# Patient Record
Sex: Male | Born: 1985 | Race: White | Hispanic: No | Marital: Single | Smoking: Current every day smoker
Health system: Southern US, Community
[De-identification: ages and names within clinical notes are randomized; demographics above are authoritative.]

## PROBLEM LIST (undated history)

## (undated) HISTORY — PX: FINGER SURGERY: SHX640

---
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
## 2005-12-09 ENCOUNTER — Emergency Department (HOSPITAL_COMMUNITY): Admission: EM | Admit: 2005-12-09 | Discharge: 2005-12-09 | Payer: Self-pay | Admitting: *Deleted

## 2006-05-28 ENCOUNTER — Emergency Department (HOSPITAL_COMMUNITY): Admission: EM | Admit: 2006-05-28 | Discharge: 2006-05-28 | Payer: Self-pay | Admitting: Emergency Medicine

## 2006-09-12 ENCOUNTER — Emergency Department (HOSPITAL_COMMUNITY): Admission: EM | Admit: 2006-09-12 | Discharge: 2006-09-12 | Payer: Self-pay | Admitting: Family Medicine

## 2007-03-02 ENCOUNTER — Emergency Department (HOSPITAL_COMMUNITY): Admission: EM | Admit: 2007-03-02 | Discharge: 2007-03-02 | Payer: Self-pay | Admitting: Emergency Medicine

## 2007-03-17 ENCOUNTER — Emergency Department (HOSPITAL_COMMUNITY): Admission: EM | Admit: 2007-03-17 | Discharge: 2007-03-17 | Payer: Self-pay | Admitting: Emergency Medicine

## 2007-07-20 ENCOUNTER — Emergency Department (HOSPITAL_COMMUNITY): Admission: EM | Admit: 2007-07-20 | Discharge: 2007-07-20 | Payer: Self-pay | Admitting: Family Medicine

## 2007-12-04 ENCOUNTER — Emergency Department (HOSPITAL_COMMUNITY): Admission: EM | Admit: 2007-12-04 | Discharge: 2007-12-04 | Payer: Self-pay | Admitting: Family Medicine

## 2008-04-17 ENCOUNTER — Emergency Department (HOSPITAL_COMMUNITY): Admission: EM | Admit: 2008-04-17 | Discharge: 2008-04-17 | Payer: Self-pay | Admitting: Emergency Medicine

## 2008-05-04 ENCOUNTER — Emergency Department (HOSPITAL_COMMUNITY): Admission: EM | Admit: 2008-05-04 | Discharge: 2008-05-04 | Payer: Self-pay | Admitting: Family Medicine

## 2008-05-30 ENCOUNTER — Encounter: Admission: RE | Admit: 2008-05-30 | Discharge: 2008-05-30 | Payer: Self-pay

## 2008-10-12 ENCOUNTER — Emergency Department (HOSPITAL_COMMUNITY): Admission: EM | Admit: 2008-10-12 | Discharge: 2008-10-12 | Payer: Self-pay | Admitting: Emergency Medicine

## 2009-03-28 ENCOUNTER — Emergency Department (HOSPITAL_COMMUNITY): Admission: EM | Admit: 2009-03-28 | Discharge: 2009-03-28 | Payer: Self-pay | Admitting: Emergency Medicine

## 2009-09-24 ENCOUNTER — Observation Stay (HOSPITAL_COMMUNITY): Admission: EM | Admit: 2009-09-24 | Discharge: 2009-09-25 | Payer: Self-pay | Admitting: Emergency Medicine

## 2011-01-19 LAB — DIFFERENTIAL
Basophils Absolute: 0 10*3/uL (ref 0.0–0.1)
Basophils Relative: 0 % (ref 0–1)
Lymphocytes Relative: 12 % (ref 12–46)
Neutro Abs: 7.8 10*3/uL — ABNORMAL HIGH (ref 1.7–7.7)
Neutrophils Relative %: 81 % — ABNORMAL HIGH (ref 43–77)

## 2011-01-19 LAB — POCT I-STAT, CHEM 8
BUN: 18 mg/dL (ref 6–23)
Chloride: 107 mEq/L (ref 96–112)
HCT: 46 % (ref 39.0–52.0)
Potassium: 3.6 mEq/L (ref 3.5–5.1)
Sodium: 142 mEq/L (ref 135–145)

## 2011-01-19 LAB — CBC
MCHC: 34.3 g/dL (ref 30.0–36.0)
RDW: 12.4 % (ref 11.5–15.5)

## 2011-01-26 LAB — URINALYSIS, ROUTINE W REFLEX MICROSCOPIC
Ketones, ur: NEGATIVE mg/dL
Leukocytes, UA: NEGATIVE
Nitrite: NEGATIVE
Specific Gravity, Urine: 1.023 (ref 1.005–1.030)
pH: 6 (ref 5.0–8.0)

## 2011-01-26 LAB — BASIC METABOLIC PANEL
BUN: 17 mg/dL (ref 6–23)
Creatinine, Ser: 1.11 mg/dL (ref 0.4–1.5)
GFR calc non Af Amer: >60 mL/min (ref >60)
Glucose, Bld: 103 mg/dL — ABNORMAL HIGH (ref 70–99)
Potassium: 4.1 mEq/L (ref 3.5–5.1)

## 2011-01-26 LAB — URINE MICROSCOPIC-ADD ON

## 2011-01-26 LAB — CBC
HCT: 40.4 % (ref 39.0–52.0)
Platelets: 228 10*3/uL (ref 150–400)
RDW: 12.6 % (ref 11.5–15.5)

## 2011-02-27 NOTE — Op Note (Signed)
[place]  Patient:    Devin Beasley, Devin Beasley Visit Number: [id number] MRN: [id number]          Service Type: DSU Location: [facility] Attending Physician:  Gustavus Messing Dictated by:   Yaakov Guthrie. Shon Hough, M.D. Proc. Date: 12/05/01 Admit Date:  12/05/2001 Discharge Date: 12/05/2001                             Operative Report  INDICATIONS:  A 25 year old with a large ganglion cyst involving the right dorsal wrist, with increased pain and discomfort on dorsiflexion.  PROCEDURE:  Excision of ganglion cyst of right dorsal wrist.  SURGEON:  Earvin Hansen L. Shon Hough, M.D.  ASSISTANT:  Margaretha Sheffield.  ANESTHESIA:  Bier block.  DESCRIPTION OF PROCEDURE:  The patient underwent a Bier block anesthesia.  A prep was done to the right hand and arm areas using Betadine soap and solution, and walled off with sterile towels and drapes, so as to make a sterile field.  A transverse incision was made over the mass and carried down through the skin and subcutaneous tissue and the superficial retinaculum, down to the ganglion cyst capsule.  Using the steel scissors I was able to dissect out gently around the whole mass in the underneath, and using the [facility] elevator I was able to dissect down to the root of the area, taking approximately 2.0 mm of synovium around the area, to remove it in toto.  After this the Bovie unit was used to coagulate around the edges of the wounds where the microscopic remained.  After this, an irrigation was done with saline and the wound was closed in layers with #5-0 Vicryl x 2 layers, then a running subcuticular stitch of #5-0 Vicryl.  Steri-Strips and soft dressings were applied to all the areas.  He withstood the procedures very well and was taken to the recovery room in excellent condition. Dictated by:   Yaakov Guthrie. Shon Hough, M.D. Attending Physician:  Gustavus Messing DD:  12/05/01 TD:  12/05/01 Job: [id number]

## 2011-05-10 ENCOUNTER — Inpatient Hospital Stay (INDEPENDENT_AMBULATORY_CARE_PROVIDER_SITE_OTHER)
Admission: RE | Admit: 2011-05-10 | Discharge: 2011-05-10 | Disposition: A | Discharge status: Home / Self Care | Payer: Self-pay | Source: Ambulatory Visit | Attending: Family Medicine | Admitting: Family Medicine

## 2011-05-10 DIAGNOSIS — L03019 Cellulitis of unspecified finger: Secondary | ICD-10-CM

## 2011-05-13 LAB — CULTURE, ROUTINE-ABSCESS

## 2011-07-30 LAB — POCT URINALYSIS DIP (DEVICE)
Glucose, UA: NEGATIVE
Ketones, ur: NEGATIVE
Operator id: 239701
Specific Gravity, Urine: 1.015

## 2013-08-28 ENCOUNTER — Emergency Department (HOSPITAL_COMMUNITY)
Admission: EM | Admit: 2013-08-28 | Discharge: 2013-08-29 | Disposition: A | Discharge status: Home / Self Care | Payer: Self-pay | Attending: Emergency Medicine | Admitting: Emergency Medicine

## 2013-08-28 ENCOUNTER — Encounter (HOSPITAL_COMMUNITY): Payer: Self-pay | Admitting: Emergency Medicine

## 2013-08-28 DIAGNOSIS — R6889 Other general symptoms and signs: Secondary | ICD-10-CM | POA: Insufficient documentation

## 2013-08-28 DIAGNOSIS — J309 Allergic rhinitis, unspecified: Secondary | ICD-10-CM | POA: Insufficient documentation

## 2013-08-28 DIAGNOSIS — F17200 Nicotine dependence, unspecified, uncomplicated: Secondary | ICD-10-CM | POA: Insufficient documentation

## 2013-08-28 DIAGNOSIS — F1110 Opioid abuse, uncomplicated: Secondary | ICD-10-CM | POA: Insufficient documentation

## 2013-08-28 LAB — CBC WITH DIFFERENTIAL/PLATELET
Basophils Relative: 1 % (ref 0–1)
Eosinophils Absolute: 0.3 10*3/uL (ref 0.0–0.7)
HCT: 38.4 % — ABNORMAL LOW (ref 39.0–52.0)
Hemoglobin: 13.8 g/dL (ref 13.0–17.0)
MCH: 34.6 pg — ABNORMAL HIGH (ref 26.0–34.0)
MCHC: 35.9 g/dL (ref 30.0–36.0)
Monocytes Absolute: 0.4 10*3/uL (ref 0.1–1.0)
Monocytes Relative: 5 % (ref 3–12)

## 2013-08-28 LAB — RAPID URINE DRUG SCREEN, HOSP PERFORMED
Amphetamines: NOT DETECTED
Cocaine: NOT DETECTED
Opiates: NOT DETECTED
Tetrahydrocannabinol: POSITIVE — AB

## 2013-08-28 LAB — COMPREHENSIVE METABOLIC PANEL
Albumin: 3.8 g/dL (ref 3.5–5.2)
BUN: 14 mg/dL (ref 6–23)
Chloride: 107 mEq/L (ref 96–112)
Creatinine, Ser: 0.93 mg/dL (ref 0.50–1.35)
GFR calc non Af Amer: >90 mL/min (ref >90)
Total Protein: 7.2 g/dL (ref 6.0–8.3)

## 2013-08-28 MED ORDER — CLONIDINE HCL 0.1 MG PO TABS
0.1000 mg | ORAL_TABLET | Freq: Four times a day (QID) | ORAL | Status: DC
  Administered 2013-08-29: 0.1 mg via ORAL
  Filled 2013-08-28: qty 1

## 2013-08-28 MED ORDER — CLONIDINE HCL 0.1 MG PO TABS: 0.1000 mg | ORAL_TABLET | ORAL | Status: DC

## 2013-08-28 MED ORDER — METHOCARBAMOL 500 MG PO TABS: 500.0000 mg | ORAL_TABLET | Freq: Three times a day (TID) | ORAL | Status: DC | PRN

## 2013-08-28 MED ORDER — HYDROXYZINE HCL 25 MG PO TABS: 25.0000 mg | ORAL_TABLET | Freq: Four times a day (QID) | ORAL | Status: DC | PRN

## 2013-08-28 MED ORDER — ONDANSETRON 4 MG PO TBDP: 4.0000 mg | ORAL_TABLET | Freq: Four times a day (QID) | ORAL | Status: DC | PRN

## 2013-08-28 MED ORDER — LOPERAMIDE HCL 2 MG PO CAPS: 2.0000 mg | ORAL_CAPSULE | ORAL | Status: DC | PRN

## 2013-08-28 MED ORDER — NAPROXEN 250 MG PO TABS: 500.0000 mg | ORAL_TABLET | Freq: Two times a day (BID) | ORAL | Status: DC | PRN

## 2013-08-28 MED ORDER — DICYCLOMINE HCL 20 MG PO TABS: 20.0000 mg | ORAL_TABLET | Freq: Four times a day (QID) | ORAL | Status: DC | PRN

## 2013-08-28 MED ORDER — CLONIDINE HCL 0.1 MG PO TABS: 0.1000 mg | ORAL_TABLET | Freq: Every day | ORAL | Status: DC

## 2013-08-28 NOTE — ED Notes (Signed)
Pt st's he takes 1-3 Roxicodones everyday

## 2013-08-28 NOTE — ED Provider Notes (Signed)
CSN: [id number]     Arrival date & time 08/28/13  2103 History   First MD Initiated Contact with Patient 08/28/13 2116     Chief Complaint  Patient presents with  . Drug Problem   (Consider location/radiation/quality/duration/timing/severity/associated sxs/prior Treatment) HPI Comments: Patient presents to the ER for evaluation of drug abuse and medical clearance for detox. The patient reports a two-year history of progressively worsening production and use of oral pain killers. Patient reports his last use was 2 nights ago. He has slight sneezing and runny nose, no other significant withdrawal symptoms. Patient has not had any redness. There is no homicidality or suicidality.  Patient is a 27 y.o. male presenting with drug problem.  Drug Problem    History reviewed. No pertinent past medical history. Past Surgical History  Procedure Laterality Date  . Finger surgery     No family history on file. History  Substance Use Topics  . Smoking status: Current Every Day Smoker  . Smokeless tobacco: Not on file  . Alcohol Use: No    Review of Systems  HENT: Positive for rhinorrhea and sneezing.   Respiratory: Negative.   Cardiovascular: Negative.   All other systems reviewed and are negative.    Allergies  Review of patient's allergies indicates no known allergies.  Home Medications  No current outpatient prescriptions on file. BP 122/66  Pulse 69  Temp(Src) 98.1 F (36.7 C) (Oral)  Resp 20  Wt 202 lb 6 oz (91.797 kg)  SpO2 99% Physical Exam  Constitutional: He is oriented to person, place, and time. He appears well-developed and well-nourished. No distress.  HENT:  Head: Normocephalic and atraumatic.  Right Ear: Hearing normal.  Left Ear: Hearing normal.  Nose: Nose normal.  Mouth/Throat: Oropharynx is clear and moist and mucous membranes are normal.  Eyes: Conjunctivae and EOM are normal. Pupils are equal, round, and reactive to light.  Neck: Normal range of  motion. Neck supple.  Cardiovascular: Regular rhythm, S1 normal and S2 normal.  Exam reveals no gallop and no friction rub.   No murmur heard. Pulmonary/Chest: Effort normal and breath sounds normal. No respiratory distress. He exhibits no tenderness.  Abdominal: Soft. Normal appearance and bowel sounds are normal. There is no hepatosplenomegaly. There is no tenderness. There is no rebound, no guarding, no tenderness at McBurney's point and negative Murphy's sign. No hernia.  Musculoskeletal: Normal range of motion.  Neurological: He is alert and oriented to person, place, and time. He has normal strength. No cranial nerve deficit or sensory deficit. Coordination normal. GCS eye subscore is 4. GCS verbal subscore is 5. GCS motor subscore is 6.  Skin: Skin is warm, dry and intact. No rash noted. No cyanosis.  Psychiatric: He has a normal mood and affect. His speech is normal and behavior is normal. Thought content normal.    ED Course  Procedures (including critical care time) Labs Review Labs Reviewed  URINE RAPID DRUG SCREEN (HOSP PERFORMED)  ETHANOL  ACETAMINOPHEN LEVEL  SALICYLATE LEVEL  CBC WITH DIFFERENTIAL  COMPREHENSIVE METABOLIC PANEL   Imaging Review No results found.  EKG Interpretation   None       MDM  Diagnosis: Opiate abuse  Patient presents to the ER stating that he has been abusing opiate painkillers for 2 years. He reports that he has contacted [facility], was told to come to the ER for medical clearance. Screening labs have been performed. Patient is not homicidal or suicidal. He is  appropriately cleared for detox.   Gilda Crease, MD 08/28/13 2241

## 2013-08-28 NOTE — ED Notes (Signed)
Paper scrubs given to pt. at triage / security notified to wand pt. at triage .

## 2013-08-28 NOTE — ED Notes (Signed)
Pt changing into scrubs 

## 2013-08-28 NOTE — ED Notes (Signed)
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 Denies visual or auditory hallucinations .

## 2013-08-29 ENCOUNTER — Encounter (HOSPITAL_COMMUNITY): Payer: Self-pay | Admitting: *Deleted

## 2013-08-29 MED ORDER — PROMETHAZINE HCL 25 MG PO TABS: 25.0000 mg | ORAL_TABLET | Freq: Four times a day (QID) | ORAL | Status: DC | PRN

## 2013-08-29 NOTE — ED Notes (Signed)
Up to bathroom voided with any problem

## 2013-08-29 NOTE — ED Notes (Signed)
Referral sent to [facility] for detox. Pt requested. [facility] does have 1 male detox bed

## 2013-08-29 NOTE — ED Notes (Addendum)
CALLED [facility] TO CHECK ON STATUS OF REFERRAL. SPOKE WITH DARLENE. SHE STATES SHE HAS NOT REVIEWED PT CHART AS YET

## 2013-08-29 NOTE — ED Provider Notes (Addendum)
Patient with hypotension. On clonidine protocol. Will hold next dose and reevaluate  Devin Donath R. Rubin Payor, MD 08/29/13 [phone]  Unable to find placement. He will discharge  Juliet Rude. Rubin Payor, MD 08/29/13 [phone]

## 2013-08-29 NOTE — ED Notes (Signed)
Pt updated on referral. Per darlene at [facility] she never received pt referral. refaxed papers to a different fax number

## 2013-08-29 NOTE — BH Assessment (Signed)
Assessment Note  Devin Beasley is a 27 y.o. male who presents with wife(who also req detox from opiates) to [facility] for detox from opiates(roxicodone).  Pt denies SI/HI/AVH, no issues with seizures/blackouts. Pt reports he uses 3 roxicodone pills, daily, last use was 2 days ago. Pt used 3 pills.  Pt has no current legal charges but is on probation for marijuana charges.  Pt also uses 1 bowl of marijuana, daily, last use was 08/28/13.  Pt is c/o w/d sxs: clammy and diarrhea.  Pt has no previous detox hx.  Pt states he contacted [facility]) for inpt admission and was informed that he needed to come to emerg dept for medical clearance before possible admission.  This Clinical research associate discussed disposition with Dr. Hyacinth Meeker and informed him that [facility] will seek placement for pt.      Axis I: Opiate Dependence  Axis II: Deferred Axis III: History reviewed. No pertinent past medical history. Axis IV: other psychosocial or environmental problems, problems related to social environment and problems with primary support group Axis V: 51-60 moderate symptoms  Past Medical History: History reviewed. No pertinent past medical history.  Past Surgical History  Procedure Laterality Date  . Finger surgery      Family History: No family history on file.  Social History:  reports that he has been smoking.  He does not have any smokeless tobacco history on file. He reports that he uses illicit drugs. He reports that he does not drink alcohol.  Additional Social History:  Alcohol / Drug Use Pain Medications: None  Prescriptions: None  Over the Counter: None  History of alcohol / drug use?: Yes Longest period of sobriety (when/how long): None  Negative Consequences of Use: Work / School;Personal relationships;Legal;Financial Withdrawal Symptoms: Diarrhea;Other (Comment) (Clammy ) Substance #1 Name of Substance 1: Opiates--Roxicodone  1 - Age of First Use: 24 YOM  1 - Amount (size/oz): 3 Pills  1 - Frequency:  Daily  1 - Duration: On-going  1 - Last Use / Amount: 2 Days Ago   CIWA: CIWA-Ar BP: 99/51 mmHg Pulse Rate: 57 Nausea and Vomiting: no nausea and no vomiting Tactile Disturbances: none Tremor: no tremor Auditory Disturbances: not present Paroxysmal Sweats: no sweat visible Visual Disturbances: not present Anxiety: no anxiety, at ease Headache, Fullness in Head: none present Agitation: normal activity Orientation and Clouding of Sensorium: oriented and can do serial additions CIWA-Ar Total: 5 COWS:    Allergies: No Known Allergies  Home Medications:  (Not in a hospital admission)  OB/GYN Status:  No LMP for male patient.  General Assessment Data Location of Assessment: [facility] ED Is this a Tele or Face-to-Face Assessment?: Tele Assessment Is this an Initial Assessment or a Re-assessment for this encounter?: Initial Assessment Living Arrangements: Spouse/significant other (Lives with spouse ) Can pt return to current living arrangement?: Yes Admission Status: Voluntary Is patient capable of signing voluntary admission?: Yes Transfer from: Acute Hospital Referral Source: MD  Medical Screening Exam [facility] Walk-in ONLY) Medical Exam completed: No Reason for MSE not completed: Other:  [facility] Crisis Care Plan Living Arrangements: Spouse/significant other (Lives with spouse ) Name of Psychiatrist: None  Name of Therapist: None   Education Status Is patient currently in school?: No Current Grade: None  Highest grade of school patient has completed: None  Name of school: None  Contact person: None   Risk to self Suicidal Ideation: No Suicidal Intent: No Is patient at risk for suicide?: No Suicidal Plan?: No Access to Means:  No What has been your use of drugs/alcohol within the last 12 months?: Abusing: opiates(roxicodone)  Previous Attempts/Gestures: No How many times?: 0 Other Self Harm Risks: None  Triggers for Past Attempts: None known Intentional Self Injurious  Behavior: None Family Suicide History: No Recent stressful life event(s): Other (Comment);Financial Problems (Chronic SA ) Persecutory voices/beliefs?: No Depression: Yes Depression Symptoms: Loss of interest in usual pleasures Substance abuse history and/or treatment for substance abuse?: No Suicide prevention information given to non-admitted patients: Not applicable  Risk to Others Homicidal Ideation: No Thoughts of Harm to Others: No Current Homicidal Intent: No Current Homicidal Plan: No Access to Homicidal Means: No Identified Victim: None  History of harm to others?: No Assessment of Violence: None Noted Violent Behavior Description: None  Does patient have access to weapons?: No Criminal Charges Pending?: No Does patient have a court date: No  Psychosis Hallucinations: None noted Delusions: None noted  Mental Status Report Appear/Hygiene: Disheveled Eye Contact: Fair Motor Activity: Unremarkable Speech: Logical/coherent Level of Consciousness: Alert Mood: Sad Affect: Sad Anxiety Level: None Thought Processes: Coherent;Relevant Judgement: Unimpaired Orientation: Person;Place;Time;Situation Obsessive Compulsive Thoughts/Behaviors: None  Cognitive Functioning Concentration: Normal Memory: Recent Intact;Remote Intact IQ: Average Insight: Fair Impulse Control: Fair Appetite: Good Weight Loss: 0 Weight Gain: 0 Sleep: Decreased Total Hours of Sleep: 6 Vegetative Symptoms: None  ADLScreening [facility] Assessment Services) Patient's cognitive ability adequate to safely complete daily activities?: Yes Patient able to express need for assistance with ADLs?: Yes Independently performs ADLs?: Yes (appropriate for developmental age)  Prior Inpatient Therapy Prior Inpatient Therapy: No Prior Therapy Dates: None  Prior Therapy Facilty/Provider(s): None  Reason for Treatment: None   Prior Outpatient Therapy Prior Outpatient Therapy: No Prior Therapy Dates: None   Prior Therapy Facilty/Provider(s): None  Reason for Treatment: None   ADL Screening (condition at time of admission) Patient's cognitive ability adequate to safely complete daily activities?: Yes Is the patient deaf or have difficulty hearing?: No Does the patient have difficulty seeing, even when wearing glasses/contacts?: No Does the patient have difficulty concentrating, remembering, or making decisions?: No Patient able to express need for assistance with ADLs?: Yes Does the patient have difficulty dressing or bathing?: No Independently performs ADLs?: Yes (appropriate for developmental age) Does the patient have difficulty walking or climbing stairs?: No Weakness of Legs: None Weakness of Arms/Hands: None  Home Assistive Devices/Equipment Home Assistive Devices/Equipment: None  Therapy Consults (therapy consults require a physician order) PT Evaluation Needed: No OT Evalulation Needed: No SLP Evaluation Needed: No Abuse/Neglect Assessment (Assessment to be complete while patient is alone) Physical Abuse: Denies Verbal Abuse: Denies Sexual Abuse: Denies Exploitation of patient/patient's resources: Denies Self-Neglect: Denies Values / Beliefs Cultural Requests During Hospitalization: None Spiritual Requests During Hospitalization: None Consults Spiritual Care Consult Needed: No Social Work Consult Needed: No Merchant navy officer (For Healthcare) Advance Directive: Patient does not have advance directive;Patient would not like information Pre-existing out of facility DNR order (yellow form or pink MOST form): No Nutrition Screen- [facility] Adult/[facility]/AP Patient's home diet: Regular  Additional Information 1:1 In Past 12 Months?: No CIRT Risk: No Elopement Risk: No Does patient have medical clearance?: Yes     Disposition:  Disposition Initial Assessment Completed for this Encounter: Yes Disposition of Patient: Referred to (Pt req possible admission w/ADACT) Patient  referred to: Other (Comment) (Pt req possible admission w/ADACT )  On Site Evaluation by:   Reviewed with Physician:    Murrell Redden 08/29/2013 8:32 AM

## 2013-08-29 NOTE — ED Notes (Signed)
Pt is on phone with his brother. Pt brother has disclosed that pt is on probation. Pt is discussing with brother that he may want to leave.

## 2013-08-29 NOTE — ED Notes (Signed)
TTS talking to pt now 

## 2013-08-29 NOTE — ED Notes (Signed)
Pt given belongings and valuables.

## 2014-05-08 ENCOUNTER — Encounter (HOSPITAL_COMMUNITY): Payer: Self-pay | Admitting: Emergency Medicine

## 2014-05-08 ENCOUNTER — Emergency Department (HOSPITAL_COMMUNITY)
Admission: EM | Admit: 2014-05-08 | Discharge: 2014-05-08 | Disposition: A | Discharge status: Home / Self Care | Payer: Self-pay | Attending: Emergency Medicine | Admitting: Emergency Medicine

## 2014-05-08 ENCOUNTER — Emergency Department (HOSPITAL_COMMUNITY): Payer: Self-pay

## 2014-05-08 DIAGNOSIS — Z79899 Other long term (current) drug therapy: Secondary | ICD-10-CM | POA: Insufficient documentation

## 2014-05-08 DIAGNOSIS — R220 Localized swelling, mass and lump, head: Secondary | ICD-10-CM | POA: Insufficient documentation

## 2014-05-08 DIAGNOSIS — R221 Localized swelling, mass and lump, neck: Secondary | ICD-10-CM

## 2014-05-08 DIAGNOSIS — K047 Periapical abscess without sinus: Secondary | ICD-10-CM | POA: Insufficient documentation

## 2014-05-08 DIAGNOSIS — F17200 Nicotine dependence, unspecified, uncomplicated: Secondary | ICD-10-CM | POA: Insufficient documentation

## 2014-05-08 DIAGNOSIS — L03211 Cellulitis of face: Secondary | ICD-10-CM

## 2014-05-08 DIAGNOSIS — L0201 Cutaneous abscess of face: Secondary | ICD-10-CM | POA: Insufficient documentation

## 2014-05-08 LAB — BASIC METABOLIC PANEL
ANION GAP: 12 (ref 5–15)
BUN: 8 mg/dL (ref 6–23)
CALCIUM: 9.3 mg/dL (ref 8.4–10.5)
CHLORIDE: 102 meq/L (ref 96–112)
CO2: 27 meq/L (ref 19–32)
Creatinine, Ser: 0.86 mg/dL (ref 0.50–1.35)
GFR calc Af Amer: >90 mL/min (ref >90)
GFR calc non Af Amer: >90 mL/min (ref >90)
Glucose, Bld: 86 mg/dL (ref 70–99)
POTASSIUM: 4.1 meq/L (ref 3.7–5.3)
Sodium: 141 mEq/L (ref 137–147)

## 2014-05-08 LAB — CBC
HCT: 39.5 % (ref 39.0–52.0)
HEMOGLOBIN: 13.6 g/dL (ref 13.0–17.0)
MCH: 34.2 pg — ABNORMAL HIGH (ref 26.0–34.0)
MCHC: 34.4 g/dL (ref 30.0–36.0)
MCV: 99.2 fL (ref 78.0–100.0)
PLATELETS: 210 10*3/uL (ref 150–400)
RBC: 3.98 MIL/uL — AB (ref 4.22–5.81)
RDW: 12.8 % (ref 11.5–15.5)
WBC: 10.6 10*3/uL — AB (ref 4.0–10.5)

## 2014-05-08 MED ORDER — OXYCODONE-ACETAMINOPHEN 5-325 MG PO TABS: 1.0000 | ORAL_TABLET | Freq: Four times a day (QID) | ORAL | Status: DC | PRN

## 2014-05-08 MED ORDER — CLINDAMYCIN PHOSPHATE 600 MG/50ML IV SOLN
600.0000 mg | Freq: Once | INTRAVENOUS | Status: AC
  Administered 2014-05-08: 600 mg via INTRAVENOUS
  Filled 2014-05-08: qty 50

## 2014-05-08 MED ORDER — CLINDAMYCIN HCL 150 MG PO CAPS: 450.0000 mg | ORAL_CAPSULE | Freq: Three times a day (TID) | ORAL | Status: DC

## 2014-05-08 MED ORDER — OXYCODONE-ACETAMINOPHEN 5-325 MG PO TABS
1.0000 | ORAL_TABLET | Freq: Once | ORAL | Status: AC
  Administered 2014-05-08: 1 via ORAL
  Filled 2014-05-08: qty 1

## 2014-05-08 MED ORDER — IOHEXOL 300 MG/ML  SOLN
70.0000 mL | Freq: Once | INTRAMUSCULAR | Status: AC | PRN
  Administered 2014-05-08: 70 mL via INTRAVENOUS

## 2014-05-08 MED ORDER — OXYCODONE HCL 5 MG PO TABS
10.0000 mg | ORAL_TABLET | Freq: Once | ORAL | Status: AC
  Administered 2014-05-08: 10 mg via ORAL
  Filled 2014-05-08: qty 2

## 2014-05-08 NOTE — ED Notes (Signed)
Pt states 3 right upper teeth infected and now has spread up into right side of face and eye.

## 2014-05-08 NOTE — ED Notes (Signed)
Pt given a LolliCaine swab to help numb his pain.

## 2014-05-08 NOTE — Discharge Instructions (Signed)
Abscessed Tooth An abscessed tooth is an infection around your tooth. It may be caused by holes or damage to the tooth (cavity) or a dental disease. An abscessed tooth causes mild to very bad pain in and around the tooth. See your dentist right away if you have tooth or gum pain. HOME CARE  Take your medicine as told. Finish it even if you start to feel better.  Do not drive after taking pain medicine.  Rinse your mouth (gargle) often with salt water ( teaspoon salt in 8 ounces of warm water).  Do not apply heat to the outside of your face. GET HELP RIGHT AWAY IF:   You have a temperature by mouth above 102 F (38.9 C), not controlled by medicine.  You have chills and a very bad headache.  You have problems breathing or swallowing.  Your mouth will not open.  You develop puffiness (swelling) on the neck or around the eye.  Your pain is not helped by medicine.  Your pain is getting worse instead of better. MAKE SURE YOU:   Understand these instructions.  Will watch your condition.  Will get help right away if you are not doing well or get worse. Document Released: 03/16/2008 Document Revised: 12/21/2011 Document Reviewed: 01/06/2011 [facility] Patient Information 2015 [place], [place]. This information is not intended to replace advice given to you by your health care provider. Make sure you discuss any questions you have with your health care provider. Cellulitis Cellulitis is an infection of the skin and the tissue beneath it. The infected area is usually red and tender. Cellulitis occurs most often in the arms and lower legs.  CAUSES  Cellulitis is caused by bacteria that enter the skin through cracks or cuts in the skin. The most common types of bacteria that cause cellulitis are staphylococci and streptococci. SIGNS AND SYMPTOMS   Redness and warmth.  Swelling.  Tenderness or pain.  Fever. DIAGNOSIS  Your health care provider can usually determine what is wrong  based on a physical exam. Blood tests may also be done. TREATMENT  Treatment usually involves taking an antibiotic medicine. HOME CARE INSTRUCTIONS   Take your antibiotic medicine as directed by your health care provider. Finish the antibiotic even if you start to feel better.  Keep the infected arm or leg elevated to reduce swelling.  Apply a warm cloth to the affected area up to 4 times per day to relieve pain.  Take medicines only as directed by your health care provider.  Keep all follow-up visits as directed by your health care provider. SEEK MEDICAL CARE IF:   You notice red streaks coming from the infected area.  Your red area gets larger or turns dark in color.  Your bone or joint underneath the infected area becomes painful after the skin has healed.  Your infection returns in the same area or another area.  You notice a swollen bump in the infected area.  You develop new symptoms.  You have a fever. SEEK IMMEDIATE MEDICAL CARE IF:   You feel very sleepy.  You develop vomiting or diarrhea.  You have a general ill feeling (malaise) with muscle aches and pains. MAKE SURE YOU:   Understand these instructions.  Will watch your condition.  Will get help right away if you are not doing well or get worse. Document Released: 07/08/2005 Document Revised: 02/12/2014 Document Reviewed: 12/14/2011 [facility] Patient Information 2015 [place], [place]. This information is not intended to replace advice given to you by  your health care provider. Make sure you discuss any questions you have with your health care provider.   Emergency Department Resource Guide 1) Find a Doctor and Pay Out of Pocket Although you won't have to find out who is covered by your insurance plan, it is a good idea to ask around and get recommendations. You will then need to call the office and see if the doctor you have chosen will accept you as a new patient and what types of options they offer for  patients who are self-pay. Some doctors offer discounts or will set up payment plans for their patients who do not have insurance, but you will need to ask so you aren't surprised when you get to your appointment.  2) Contact Your Local Health Department Not all health departments have doctors that can see patients for sick visits, but many do, so it is worth a call to see if yours does. If you don't know where your local health department is, you can check in your phone book. The CDC also has a tool to help you locate your state's health department, and many state websites also have listings of all of their local health departments.  3) Find a Walk-in Clinic If your illness is not likely to be very severe or complicated, you may want to try a walk in clinic. These are popping up all over the country in pharmacies, drugstores, and shopping centers. They're usually staffed by nurse practitioners or physician assistants that have been trained to treat common illnesses and complaints. They're usually fairly quick and inexpensive. However, if you have serious medical issues or chronic medical problems, these are probably not your best option.  No Primary Care Doctor: - Call Health Connect at  [phone] - they can help you locate a primary care doctor that  accepts your insurance, provides certain services, etc. - Physician Referral Service- [phone]  Chronic Pain Problems: Organization         Address  Phone   Notes  [facility]  [phone] Patients need to be referred by their primary care doctor.   Medication Assistance: Organization         Address  Phone   Notes  [facility] Medication [facility] P[facility]istance Program [address] [place], [place] [address] [phone] --Must be a resident of [place] -- Must have NO insurance coverage whatsoever (no Medicaid/ Medicare, etc.) -- The pt. MUST have a primary care doctor that directs their care regularly  and follows them in the community   MedAssist  [phone]) [phone]   [employer]  [phone]    Agencies that provide inexpensive medical care: Organization         Address  Phone   Notes  [facility]  [phone]   [facility]    [phone]   [facility] [address] [place], [place] [address] [phone]   [facility] [place] [address], [place] [phone]) [phone]   [facility]anned Parenthood    [phone]) [phone]   [facility]    [phone]   [facility]  [address], [place] Phone:  [phone]) [phone], Fax:  [phone] Hours of Operation:  9 am - 6 pm, M-F.  Also accepts Medicaid/Medicare and self-pay.  [facility]  [address], [place] Phone: [phone]) [phone], Fax: [phone]. Hours  of Operation:  8:30 am - 5:30 pm, M-F.  Also accepts Medicaid and self-pay.  [facility] [place] [address], [place] Phone: [phone]) [phone]   [facility] [address], [place], [place] [phone], Ext. [phone] Mondays & Thursdays: 7-9 AM.  First 15 patients are seen on a first come, first serve basis.    Medicaid-accepting [facility] [place] Providers:  Organization         Address  Phone   Notes  [facility] [address], [place] [phone] Also accepts self-pay patients.  [facility] : [facility] [address], [place]  [phone]   [facility] [address], [place] [phone]) [phone]   [facility] [address] [place] [address], [place] [phone]   Renaye RakersVeita Bland [address] [place] [address], [place]   [phone] Only accepts [place] Access [place] patients after they have their name applied to their card.   Self-Pay (no insurance) in [place]:  Organization         Address  Phone   Notes  [facility]ickle  Cell Patients, [facility] [address], [place] [phone]   [facility] [address], [place] [phone]   [facility] [place]  [address], [place] [phone]   [facility]/Dr. Osei-Bonsu  [address] [place] [address], [place] or [id number], [place] [phone] Phone number for both [place] and [place] locations is the same.  [facility] [address], [place] [phone]   [facility]  [address] [place] [address], [place] or [address] [phone]) [phone]   [facility] [address], [place] [phone], phone; [phone], fax Sees patients 1st and 3rd Saturday of every month.  Must not qualify for public or private insurance (i.e. Medicaid, Medicare, [place] Health Choice, Veterans' Benefits)  Household income should be no more than 200% of the poverty level The clinic cannot treat you if you are pregnant or think you are pregnant  Sexually transmitted diseases are not treated at the clinic.    Dental Care: Organization         Address  Phone  Notes  [facility] [address], [place] [phone]) [phone] Accepts children up to age 29 who are enrolled in [place]icaid or [place] Health Choice; pregnant women with a Medicaid card; and children who have applied for Medicaid or [place] Health Choice, but were declined, whose parents can pay a reduced fee at time of service.  [facility] [place]  [address], [place] [phone] Accepts children up to age 29 who are enrolled in [place] or [place] Health Choice; pregnant women with a Medicaid card; and children who have applied for Medicaid or [place] Health Choice, but were declined, whose parents can pay a reduced fee at time of service.  [facility]  [address], [place] [phone] Patients are seen by appointment only. Walk-ins are not accepted. [facility] will see patients 29 years of age and older. Monday - Tuesday (8am-5pm) Most Wednesdays (8:30-5pm) $30 per visit, cash only  [facility]  [address], [place] [phone] Patients are  seen by appointment only. Walk-ins are not accepted. [facility] will see patients 618 years of age and older. One Wednesday Evening (Monthly: Volunteer Based).  $30 per visit, cash only  [employer] [facility] [facility]  [phone] for adults; Children under age 624, call Graduate Pediatric Dentistry at [phone]) [phone]. Children aged 164-14, please call [phone] to request a pediatric application.  Dental services are provided in all areas of dental care including fillings, crowns and bridges, complete and partial dentures, implants, gum treatment, root canals, and extractions. Preventive care is also provided. Treatment is provided to both adults and children. Patients are selected via a lottery and there is often a waiting list.   [facility] [address] [address], [place]  [phone]) [phone] [email or web address]   [facility] [address], [place], [place] [phone], Ext. [phone] Second and Fourth Thursday of each month, opens at 6:30 AM; Clinic ends at 9 AM.  Patients are seen on a first-come first-served basis, and a limited number are seen during each clinic.   [facility]  [address], [place], [place] [phone]   Eligibility Requirements You must have lived in [place], [place], or [place] counties for at least the last three months.   You cannot be eligible for state or federal sponsored National Cityhealthcare insurance, including CIGNAVeterans Administration, [place]icaid, or [employer] EntertainmentMedicare.   You generally cannot be eligible for healthcare insurance through your employer.    How to apply: Eligibility  screenings are held every Tuesday and Wednesday afternoon from 1:00 pm until 4:00 pm. You do not need an appointment for the interview!  [facility] [address], [place], [place] [phone]   [facility]  [phone]   [facility]  [phone]   [facility]  [phone]    Behavioral Health Resources in the Community: Intensive Outpatient Programs Organization         Address  Phone  Notes  [facility] [address], [place], [place] [phone]   [facility] [address], [place], [place] [phone]   [facility] [address], [place], [place]  [phone]   [facility] [address],  [place], [place] [phone] or [phone]   Substance Abuse Resources Organization         Address  Phone  Notes  [facility]  [phone]   [facility]  [phone]   [facility]  [phone]   [facility]  [phone]   Residential & Outpatient Substance Abuse Program  [phone]   Psychological Services Organization         Address  Phone  Notes  [facility]  [phone]- [phone]   [facility]  [phone]- [phone]   [facility] [address], [place] [phone] or [phone]    Mobile Crisis Teams Organization         Address  Phone  Notes  [facility], Mobile Crisis Care Unit  [phone]   [facility]  [address] [place], [place] [phone]   Doristine LocksSharon DeEsch [address] [place] [phone]    Self-Help/Support Groups Organization         Address  Phone             Notes  [facility] [place] - variety of support groups  336- [phone] Call for more  information  °Narcotics Anonymous (NA), [facility] [address], °[place]  2 meetings at  this location  ° °Residential Treatment Programs °Organization         Address  Phone  Notes  °[facility] [address],    °[place]  [phone]   °[facility] ° [address], [place], [place] [phone]   °[facility] [address], [place] [phone] Admissions: 8am-3pm M-F  °[facility] [address]    °[place], [place] [phone]   °[facility] [address], [place], [place] [phone]   °[facility] [address]  °[place], [place] [phone]   °[facility] - Intensive Outpatient [address], [place], [place] [phone]   °[facility] ([facility] [address]  °[place], [place] 1-877-615-2722 or 336-784-9470   °[facility] ([facility]) [address] [place], [place] [phone] Accepts Medicaid  °[facility] [address]  °[place] 1-800-659-3381 Substance Abuse/Addiction Treatment  ° °[place] Behavioral Health Resources °Organization         Address  Phone  Notes  °[facility]  (888) [phone]   °Julie Brannon, PhD [address] [place], [place]   (336) [phone] or (336) 951-0000   °[facility]   [address] °[place], [place] (336) 349-4454   °[facility] [address], [place], [place] (336) [phone] Insurance/Medicaid/sponsorship through [facility]  °[facility] [address]                                    [place], [place] (336) [phone] Therapy/tele-psych/case  °[facility] [address]  ° [place], [place] (336) [phone]    °Dr. Arfeen  (336) [phone]   °[facility] [place]  [employer] [place] [facility] 1) [address], [place] °2) [address], [place] °3)  [address], [place] (336) [phone] °(336) 342-7768 ° °(336) [phone]   °[place] Child Abuse Hotline (336) 342-1394 or (336)  342-3537 (After Hours)    ° ° ° °

## 2014-05-11 NOTE — ED Provider Notes (Signed)
CSN: [id number]     Arrival date & time 05/08/14  1614 History   First MD Initiated Contact with Patient 05/08/14 2014     Chief Complaint  Patient presents with  . Facial Swelling     (Consider location/radiation/quality/duration/timing/severity/associated sxs/prior Treatment) Patient is a 28 y.o. male presenting with tooth pain. The history is provided by the patient. No language interpreter was used.  Dental Pain Location:  Upper Upper teeth location:  6/RU cuspid Quality:  Dull Severity:  Moderate Onset quality:  Sudden Duration:  3 days Timing:  Constant Progression:  Worsening Chronicity:  New Context: dental caries and poor dentition   Relieved by:  Nothing Worsened by:  Pressure and touching Ineffective treatments:  None tried Associated symptoms: facial pain, facial swelling and gum swelling   Associated symptoms: no congestion, no difficulty swallowing, no drooling, no fever, no headaches, no neck swelling and no trismus     History reviewed. No pertinent past medical history. Past Surgical History  Procedure Laterality Date  . Finger surgery     No family history on file. History  Substance Use Topics  . Smoking status: Current Every Day Smoker  . Smokeless tobacco: Not on file  . Alcohol Use: No    Review of Systems  Constitutional: Negative for fever, activity change, appetite change and fatigue.  HENT: Positive for dental problem and facial swelling. Negative for congestion, drooling, rhinorrhea and trouble swallowing.   Eyes: Negative for photophobia and pain.  Respiratory: Negative for cough, chest tightness and shortness of breath.   Cardiovascular: Negative for chest pain and leg swelling.  Gastrointestinal: Negative for nausea, vomiting, abdominal pain, diarrhea and constipation.  Endocrine: Negative for polydipsia and polyuria.  Genitourinary: Negative for dysuria, urgency, decreased urine volume and difficulty urinating.  Musculoskeletal:  Negative for back pain and gait problem.  Skin: Negative for color change, rash and wound.  Allergic/Immunologic: Negative for immunocompromised state.  Neurological: Negative for dizziness, facial asymmetry, speech difficulty, weakness, numbness and headaches.  Psychiatric/Behavioral: Negative for confusion, decreased concentration and agitation.      Allergies  Review of patient's allergies indicates no known allergies.  Home Medications   Prior to Admission medications   Medication Sig Start Date End Date Taking? Authorizing Provider  ibuprofen (ADVIL,MOTRIN) 200 MG tablet Take 800 mg by mouth every 4 (four) hours as needed for mild pain (toothache).   Yes Historical Provider, MD  clindamycin (CLEOCIN) 150 MG capsule Take 3 capsules (450 mg total) by mouth 3 (three) times daily. For 10 days. Take all pills. 05/08/14   Shanna CiscoMegan E Docherty, MD  oxyCODONE-acetaminophen (PERCOCET/ROXICET) 5-325 MG per tablet Take 1 tablet by mouth every 6 (six) hours as needed for moderate pain or severe pain. 05/08/14   Shanna CiscoMegan E Docherty, MD   BP 121/74  Pulse 76  Temp(Src) 98.3 F (36.8 C) (Oral)  Resp 14  SpO2 99% Physical Exam  Constitutional: He is oriented to person, place, and time. He appears well-developed and well-nourished. No distress.  HENT:  Head: Normocephalic and atraumatic.    Mouth/Throat: No oropharyngeal exudate.    Eyes: Pupils are equal, round, and reactive to light.  Neck: Normal range of motion. Neck supple.  Cardiovascular: Normal rate, regular rhythm and normal heart sounds.  Exam reveals no gallop and no friction rub.   No murmur heard. Pulmonary/Chest: Effort normal and breath sounds normal. No respiratory distress. He has no wheezes. He has no rales.  Abdominal: Soft. Bowel sounds are normal. He exhibits  no distension and no mass. There is no tenderness. There is no rebound and no guarding.  Musculoskeletal: Normal range of motion. He exhibits no edema and no tenderness.   Neurological: He is alert and oriented to person, place, and time.  Skin: Skin is warm and dry.  Psychiatric: He has a normal mood and affect.    ED Course  INCISION AND DRAINAGE Date/Time: 05/08/2014 9:37 PM Performed by: Toy Cookey, E Authorized by: Toy Cookey, E Consent: Verbal consent obtained. written consent not obtained. Risks and benefits: risks, benefits and alternatives were discussed Consent given by: patient Patient understanding: patient states understanding of the procedure being performed Patient consent: the patient's understanding of the procedure matches consent given Procedure consent: procedure consent matches procedure scheduled Relevant documents: relevant documents present and verified Test results: test results available and properly labeled Site marked: the operative site was marked Imaging studies: imaging studies available Required items: required blood products, implants, devices, and special equipment available Patient identity confirmed: verbally with patient Time out: Immediately prior to procedure a "time out" was called to verify the correct patient, procedure, equipment, support staff and site/side marked as required. Type: abscess Body area: mouth Location details: alveolar process Anesthesia: local infiltration Local anesthetic: lidocaine 1% with epinephrine Anesthetic total: 2 ml Patient sedated: no Scalpel size: 11 Incision type: elliptical Complexity: simple Drainage: purulent Drainage amount: copious Patient tolerance: Patient tolerated the procedure well with no immediate complications.   (including critical care time) Labs Review Labs Reviewed  CBC - Abnormal; Notable for the following:    WBC 10.6 (*)    RBC 3.98 (*)    MCH 34.2 (*)    All other components within normal limits  BASIC METABOLIC PANEL    Imaging Review No results found.   EKG Interpretation None      MDM   Final diagnoses:  Dental abscess    Facial cellulitis    Pt is a 28 y.o. male with Pmhx as above who presents with enlarging dental abscess over tooth # 6 as well as development of worsening facial cellulitis. No trismus, no difficulty swallowing, no submental swelling. CT face w/ abscess along R maxillary margin which is easily palpable on PE.  I&D done w/ large amt of purulent fluid drained. IV clinda given and he will continue this at home. Strict return precautions given for new or worsening symptoms. He agrees to see his dentist ASAP.          Shanna Cisco, MD 05/11/14 2055

## 2014-11-26 ENCOUNTER — Encounter (HOSPITAL_COMMUNITY): Payer: Self-pay

## 2014-11-26 ENCOUNTER — Emergency Department (HOSPITAL_COMMUNITY)
Admission: EM | Admit: 2014-11-26 | Discharge: 2014-11-26 | Discharge status: Against Medical Advice | Payer: Self-pay | Attending: Emergency Medicine | Admitting: Emergency Medicine

## 2014-11-26 DIAGNOSIS — R590 Localized enlarged lymph nodes: Secondary | ICD-10-CM | POA: Insufficient documentation

## 2014-11-26 DIAGNOSIS — Z720 Tobacco use: Secondary | ICD-10-CM | POA: Insufficient documentation

## 2014-11-26 DIAGNOSIS — H9209 Otalgia, unspecified ear: Secondary | ICD-10-CM | POA: Insufficient documentation

## 2014-11-26 DIAGNOSIS — Z792 Long term (current) use of antibiotics: Secondary | ICD-10-CM | POA: Insufficient documentation

## 2014-11-26 DIAGNOSIS — K029 Dental caries, unspecified: Secondary | ICD-10-CM | POA: Insufficient documentation

## 2014-11-26 DIAGNOSIS — R6884 Jaw pain: Secondary | ICD-10-CM | POA: Insufficient documentation

## 2014-11-26 NOTE — ED Provider Notes (Signed)
CSN: [id number]     Arrival date & time 11/26/14  1855 History   First MD Initiated Contact with Patient 11/26/14 1904    This chart was scribed for non-physician practitioner, Terri Piedraourtney Forcucci, PA, working with Doug SouSam Jacubowitz, MD by Marica OtterNusrat Rahman, ED Scribe. This patient was seen in room TR08C/TR08C and the patient's care was started at 7:08 PM.  No chief complaint on file.  The history is provided by the patient. No language interpreter was used.   PCP: No PCP Per Patient HPI Comments: Devin Beasley is a 29 y.o. male, with PMH noted below and daily tobacco use (0.5 ppd since 29 yo), who presents to the Emergency Department complaining of atraumatic, sudden onset bumps on left side of his head (onset one week ago) and neck (onset couple of days ago). Pt also complains of associated jaw pain and ear pain onset today. Pt denies taking any measures at home to alleviate his Sx. Pt denies cough, SOB, fever, n/v, abd pain.   History reviewed. No pertinent past medical history. Past Surgical History  Procedure Laterality Date  . Finger surgery     History reviewed. No pertinent family history. History  Substance Use Topics  . Smoking status: Current Every Day Smoker  . Smokeless tobacco: Not on file  . Alcohol Use: No    Review of Systems  Constitutional: Negative for fever and chills.  HENT: Positive for ear pain.   Respiratory: Negative for cough and shortness of breath.   Gastrointestinal: Negative for nausea, vomiting and abdominal pain.  Musculoskeletal:       Jaw pain  Skin:       Bumps on side of head and neck   Psychiatric/Behavioral: Negative for confusion.  All other systems reviewed and are negative.  Allergies  Review of patient's allergies indicates no known allergies.  Home Medications   Prior to Admission medications   Medication Sig Start Date End Date Taking? Authorizing Provider  clindamycin (CLEOCIN) 150 MG capsule Take 3 capsules (450 mg total) by mouth 3  (three) times daily. For 10 days. Take all pills. 05/08/14   Toy CookeyMegan Docherty, MD  ibuprofen (ADVIL,MOTRIN) 200 MG tablet Take 800 mg by mouth every 4 (four) hours as needed for mild pain (toothache).    Historical Provider, MD  oxyCODONE-acetaminophen (PERCOCET/ROXICET) 5-325 MG per tablet Take 1 tablet by mouth every 6 (six) hours as needed for moderate pain or severe pain. 05/08/14   Toy CookeyMegan Docherty, MD   Triage Vitals: BP 134/73 mmHg  Pulse 79  Temp(Src) 98.4 F (36.9 C)  Resp 16  SpO2 99% Physical Exam  Constitutional: He is oriented to person, place, and time. He appears well-developed and well-nourished. No distress.  HENT:  Head: Normocephalic and atraumatic.  Right Ear: External ear normal.  Left Ear: External ear normal.  Nose: Mucosal edema present.  Mouth/Throat: Uvula is midline and oropharynx is clear and moist. No trismus in the jaw. Dental caries present.    Eyes: Conjunctivae and EOM are normal. Pupils are equal, round, and reactive to light.  Neck: Normal range of motion. Neck supple. No JVD present. No tracheal deviation present. No thyromegaly present.  Cardiovascular: Normal rate, regular rhythm, normal heart sounds and intact distal pulses.  Exam reveals no gallop and no friction rub.   No murmur heard. Pulmonary/Chest: Effort normal and breath sounds normal. No respiratory distress. He has no wheezes. He has no rales. He exhibits no tenderness.  Musculoskeletal: Normal range of motion.  Lymphadenopathy:  He has cervical adenopathy (on right).  Neurological: He is alert and oriented to person, place, and time.  Skin: Skin is warm and dry.  Psychiatric: He has a normal mood and affect. His behavior is normal. Judgment and thought content normal.  Nursing note and vitals reviewed.   ED Course  Procedures (including critical care time) DIAGNOSTIC STUDIES: Oxygen Saturation is 99% on RA, nl by my interpretation.    COORDINATION OF CARE: 7:14 PM-Discussed  treatment plan which includes meds, community health [facility] wellness center referral with pt at bedside and pt agreed to plan.   Labs Review Labs Reviewed - No data to display  Imaging Review No results found.   EKG Interpretation None      MDM   Final diagnoses:  Cervical adenopathy   Patient presents emergency room for evaluation of cervical adenopathy. Patient does have adenopathy present on examination. He does have mucosal edema and poor dental care. Patient recently did not finish her course of antibiotics for dental infection. This could be viral infection versus bacterial infection causing reactive lymphadenopathy. Have recommended completing a course of amoxicillin to treat for possible dental infection and also treating symptomatically with Tylenol or ibuprofen as needed for pain. Patient became very upset that I could not give an answer to his problem. He eloped prior to being given paperwork. I offered Ultram which she declined. He stated that he had ibuprofen and amoxicillin at home.  I personally performed the services described in this documentation, which was scribed in my presence. The recorded information has been reviewed and is accurate.     Eben Burow, PA-C 11/26/14 1923  Doug Sou, MD 11/27/14 [phone]

## 2014-11-26 NOTE — ED Notes (Signed)
Pt left prior tp receiving d/c paperwork or signing. EDP notified.

## 2014-11-26 NOTE — ED Notes (Signed)
Pt walked out prior to receiving d/c paperwork. EDP notified.

## 2014-11-26 NOTE — ED Notes (Signed)
Pt reports lumps on side of head and neck that started about a week ago and have gotten worse.  Denies hitting head.  Sts lumps are starting to become painful.

## 2015-09-15 ENCOUNTER — Emergency Department (HOSPITAL_COMMUNITY): Payer: Medicaid Other

## 2015-09-15 ENCOUNTER — Emergency Department (HOSPITAL_COMMUNITY)
Admission: EM | Admit: 2015-09-15 | Discharge: 2015-09-15 | Disposition: A | Discharge status: Home / Self Care | Payer: Medicaid Other | Attending: Emergency Medicine | Admitting: Emergency Medicine

## 2015-09-15 ENCOUNTER — Encounter (HOSPITAL_COMMUNITY): Payer: Self-pay | Admitting: *Deleted

## 2015-09-15 DIAGNOSIS — S299XXA Unspecified injury of thorax, initial encounter: Secondary | ICD-10-CM | POA: Insufficient documentation

## 2015-09-15 DIAGNOSIS — S6991XA Unspecified injury of right wrist, hand and finger(s), initial encounter: Secondary | ICD-10-CM | POA: Insufficient documentation

## 2015-09-15 DIAGNOSIS — Y92410 Unspecified street and highway as the place of occurrence of the external cause: Secondary | ICD-10-CM | POA: Insufficient documentation

## 2015-09-15 DIAGNOSIS — S29002A Unspecified injury of muscle and tendon of back wall of thorax, initial encounter: Secondary | ICD-10-CM | POA: Diagnosis not present

## 2015-09-15 DIAGNOSIS — S199XXA Unspecified injury of neck, initial encounter: Secondary | ICD-10-CM | POA: Diagnosis present

## 2015-09-15 DIAGNOSIS — S3992XA Unspecified injury of lower back, initial encounter: Secondary | ICD-10-CM | POA: Diagnosis not present

## 2015-09-15 DIAGNOSIS — F17200 Nicotine dependence, unspecified, uncomplicated: Secondary | ICD-10-CM | POA: Diagnosis not present

## 2015-09-15 DIAGNOSIS — S161XXA Strain of muscle, fascia and tendon at neck level, initial encounter: Secondary | ICD-10-CM | POA: Diagnosis not present

## 2015-09-15 DIAGNOSIS — Y9355 Activity, bike riding: Secondary | ICD-10-CM | POA: Insufficient documentation

## 2015-09-15 DIAGNOSIS — Y998 Other external cause status: Secondary | ICD-10-CM | POA: Insufficient documentation

## 2015-09-15 MED ORDER — IBUPROFEN 800 MG PO TABS
800.0000 mg | ORAL_TABLET | Freq: Once | ORAL | Status: AC
Start: 2015-09-15 — End: 2015-09-15
  Administered 2015-09-15: 800 mg via ORAL
  Filled 2015-09-15: qty 1

## 2015-09-15 MED ORDER — HYDROCODONE-ACETAMINOPHEN 5-325 MG PO TABS
2.0000 | ORAL_TABLET | Freq: Once | ORAL | Status: AC
  Administered 2015-09-15: 2 via ORAL
  Filled 2015-09-15: qty 2

## 2015-09-15 MED ORDER — IBUPROFEN 800 MG PO TABS: 800.0000 mg | ORAL_TABLET | Freq: Three times a day (TID) | ORAL | Status: DC
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
## 2015-09-15 NOTE — Discharge Instructions (Signed)
Cervical Sprain Take the antiinflammatory medication as prescribed. Follow up with your doctor. Return to the ED if you develop new or worsening symptoms. A cervical sprain is an injury in the neck in which the strong, fibrous tissues (ligaments) that connect your neck bones stretch or tear. Cervical sprains can range from mild to severe. Severe cervical sprains can cause the neck vertebrae to be unstable. This can lead to damage of the spinal cord and can result in serious nervous system problems. The amount of time it takes for a cervical sprain to get better depends on the cause and extent of the injury. Most cervical sprains heal in 1 to 3 weeks. CAUSES  Severe cervical sprains may be caused by:   Contact sport injuries (such as from football, rugby, wrestling, hockey, auto racing, gymnastics, diving, martial arts, or boxing).   Motor vehicle collisions.   Whiplash injuries. This is an injury from a sudden forward and backward whipping movement of the head and neck.  Falls.  Mild cervical sprains may be caused by:   Being in an awkward position, such as while cradling a telephone between your ear and shoulder.   Sitting in a chair that does not offer proper support.   Working at a poorly Marketing executivedesigned computer station.   Looking up or down for long periods of time.  SYMPTOMS   Pain, soreness, stiffness, or a burning sensation in the front, back, or sides of the neck. This discomfort may develop immediately after the injury or slowly, 24 hours or more after the injury.   Pain or tenderness directly in the middle of the back of the neck.   Shoulder or upper back pain.   Limited ability to move the neck.   Headache.   Dizziness.   Weakness, numbness, or tingling in the hands or arms.   Muscle spasms.   Difficulty swallowing or chewing.   Tenderness and swelling of the neck.  DIAGNOSIS  Most of the time your health care provider can diagnose a cervical sprain  by taking your history and doing a physical exam. Your health care provider will ask about previous neck injuries and any known neck problems, such as arthritis in the neck. X-rays may be taken to find out if there are any other problems, such as with the bones of the neck. Other tests, such as a CT scan or MRI, may also be needed.  TREATMENT  Treatment depends on the severity of the cervical sprain. Mild sprains can be treated with rest, keeping the neck in place (immobilization), and pain medicines. Severe cervical sprains are immediately immobilized. Further treatment is done to help with pain, muscle spasms, and other symptoms and may include:  Medicines, such as pain relievers, numbing medicines, or muscle relaxants.   Physical therapy. This may involve stretching exercises, strengthening exercises, and posture training. Exercises and improved posture can help stabilize the neck, strengthen muscles, and help stop symptoms from returning.  HOME CARE INSTRUCTIONS   Put ice on the injured area.   Put ice in a plastic bag.   Place a towel between your skin and the bag.   Leave the ice on for 15-20 minutes, 3-4 times a day.   If your injury was severe, you may have been given a cervical collar to wear. A cervical collar is a two-piece collar designed to keep your neck from moving while it heals.  Do not remove the collar unless instructed by your health care provider.  If you have  long hair, keep it outside of the collar.  Ask your health care provider before making any adjustments to your collar. Minor adjustments may be required over time to improve comfort and reduce pressure on your chin or on the back of your head.  Ifyou are allowed to remove the collar for cleaning or bathing, follow your health care provider's instructions on how to do so safely.  Keep your collar clean by wiping it with mild soap and water and drying it completely. If the collar you have been given includes  removable pads, remove them every 1-2 days and hand wash them with soap and water. Allow them to air dry. They should be completely dry before you wear them in the collar.  If you are allowed to remove the collar for cleaning and bathing, wash and dry the skin of your neck. Check your skin for irritation or sores. If you see any, tell your health care provider.  Do not drive while wearing the collar.   Only take over-the-counter or prescription medicines for pain, discomfort, or fever as directed by your health care provider.   Keep all follow-up appointments as directed by your health care provider.   Keep all physical therapy appointments as directed by your health care provider.   Make any needed adjustments to your workstation to promote good posture.   Avoid positions and activities that make your symptoms worse.   Warm up and stretch before being active to help prevent problems.  SEEK MEDICAL CARE IF:   Your pain is not controlled with medicine.   You are unable to decrease your pain medicine over time as planned.   Your activity level is not improving as expected.  SEEK IMMEDIATE MEDICAL CARE IF:   You develop any bleeding.  You develop stomach upset.  You have signs of an allergic reaction to your medicine.   Your symptoms get worse.   You develop new, unexplained symptoms.   You have numbness, tingling, weakness, or paralysis in any part of your body.  MAKE SURE YOU:   Understand these instructions.  Will watch your condition.  Will get help right away if you are not doing well or get worse.   This information is not intended to replace advice given to you by your health care provider. Make sure you discuss any questions you have with your health care provider.   Document Released: 07/26/2007 Document Revised: 10/03/2013 Document Reviewed: 04/05/2013 [employer] Interactive Patient Education [employer]
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
## 2016-04-01 ENCOUNTER — Other Ambulatory Visit: Payer: Self-pay

## 2016-04-01 ENCOUNTER — Emergency Department (HOSPITAL_COMMUNITY): Payer: Medicaid Other

## 2016-04-01 ENCOUNTER — Encounter (HOSPITAL_COMMUNITY): Payer: Self-pay | Admitting: Emergency Medicine

## 2016-04-01 DIAGNOSIS — F17200 Nicotine dependence, unspecified, uncomplicated: Secondary | ICD-10-CM | POA: Diagnosis not present

## 2016-04-01 DIAGNOSIS — R0789 Other chest pain: Secondary | ICD-10-CM | POA: Diagnosis not present

## 2016-04-01 DIAGNOSIS — Z79899 Other long term (current) drug therapy: Secondary | ICD-10-CM | POA: Insufficient documentation

## 2016-04-01 LAB — I-STAT TROPONIN, ED: Troponin i, poc: 0 ng/mL (ref 0.00–0.08)

## 2016-04-01 LAB — CBC
HCT: 40.2 % (ref 39.0–52.0)
Hemoglobin: 13.9 g/dL (ref 13.0–17.0)
MCH: 33.9 pg (ref 26.0–34.0)
MCHC: 34.6 g/dL (ref 30.0–36.0)
MCV: 98 fL (ref 78.0–100.0)
PLATELETS: 226 10*3/uL (ref 150–400)
RBC: 4.1 MIL/uL — AB (ref 4.22–5.81)
RDW: 13.4 % (ref 11.5–15.5)
WBC: 11.6 10*3/uL — ABNORMAL HIGH (ref 4.0–10.5)

## 2016-04-01 LAB — BASIC METABOLIC PANEL
Anion gap: 9 (ref 5–15)
BUN: 12 mg/dL (ref 6–20)
CHLORIDE: 105 mmol/L (ref 101–111)
CO2: 25 mmol/L (ref 22–32)
CREATININE: 1.08 mg/dL (ref 0.61–1.24)
Calcium: 9.7 mg/dL (ref 8.9–10.3)
GFR calc non Af Amer: >60 mL/min (ref >60)
Glucose, Bld: 109 mg/dL — ABNORMAL HIGH (ref 65–99)
Potassium: 3.6 mmol/L (ref 3.5–5.1)
Sodium: 139 mmol/L (ref 135–145)

## 2016-04-01 NOTE — ED Notes (Signed)
Pt. reports central chest pain with pressure at left axilla and left fingers tingling onset this week , mild SOB , occasional productive cough and nausea.

## 2016-04-02 ENCOUNTER — Emergency Department (HOSPITAL_COMMUNITY)
Admission: EM | Admit: 2016-04-02 | Discharge: 2016-04-02 | Discharge status: Against Medical Advice | Payer: Medicaid Other | Attending: Emergency Medicine | Admitting: Emergency Medicine

## 2016-04-02 DIAGNOSIS — R0789 Other chest pain: Secondary | ICD-10-CM

## 2016-04-02 MED ORDER — SODIUM CHLORIDE 0.9 % IV BOLUS (SEPSIS): 1000.0000 mL | Freq: Once | INTRAVENOUS | Status: DC

## 2016-04-02 MED ORDER — KETOROLAC TROMETHAMINE 30 MG/ML IJ SOLN
30.0000 mg | Freq: Once | INTRAMUSCULAR | Status: DC
  Filled 2016-04-02: qty 1

## 2016-04-02 MED ORDER — IPRATROPIUM-ALBUTEROL 0.5-2.5 (3) MG/3ML IN SOLN
3.0000 mL | Freq: Once | RESPIRATORY_TRACT | Status: DC
  Filled 2016-04-02: qty 3

## 2016-04-02 NOTE — ED Notes (Signed)
Pt Stated "I want a second opinion I am going to a different hospital." pt has stable vital signs and ambulated out of facility with no problems.

## 2016-04-03 NOTE — ED Provider Notes (Signed)
CSN: [id number]     Arrival date & time 04/01/16  2217 History   First MD Initiated Contact with Patient 04/02/16 0119     Chief Complaint  Patient presents with  . Chest Pain     (Consider location/radiation/quality/duration/timing/severity/associated sxs/prior Treatment) HPI   Patient is a 30 year old male, current smoker, who presents to the ER with left anterior and left lateral chest pain which began 4 days ago when he was lifting something over his head at work. The pain is described as achy and intermittently sharp, with associated tingles and pain shooting down his left arm. It is worse with movement and palpation. He states it is not reproduced with exertion.  He also complains of intermittent shortness of breath and chronic cough which has recently worsened with white sputum.  He denies any history of diabetes or hypertension. He denies any immediate family member cardiac history. He does request to be tested for "blood clots" because he has a "clotting disorder."  He denies history of DVT or PE. He has not had any lower extremity edema or erythema, currently does not have any shortness of breath, no orthopnea, PND, palpitations.  Denies recent travel or prolonged periods of stasis, no trauma. He denies wheeze, fever, chills, sweats.  He denies weight loss. No recent sick contacts.  He denies any alcohol use or illegal drug use including cocaine.    History reviewed. No pertinent past medical history. Past Surgical History  Procedure Laterality Date  . Finger surgery     No family history on file. Social History  Substance Use Topics  . Smoking status: Current Every Day Smoker  . Smokeless tobacco: None  . Alcohol Use: No    Review of Systems  All other systems reviewed and are negative.     Allergies  Review of patient's allergies indicates no known allergies.  Home Medications   Prior to Admission medications   Medication Sig Start Date End Date Taking? Authorizing  Provider  clindamycin (CLEOCIN) 150 MG capsule Take 3 capsules (450 mg total) by mouth 3 (three) times daily. For 10 days. Take all pills. Patient not taking: Reported on 09/15/2015 05/08/14   Toy CookeyMegan Docherty, MD  ibuprofen (ADVIL,MOTRIN) 800 MG tablet Take 1 tablet (800 mg total) by mouth 3 (three) times daily. 09/15/15   Glynn OctaveStephen Rancour, MD  oxyCODONE-acetaminophen (PERCOCET/ROXICET) 5-325 MG per tablet Take 1 tablet by mouth every 6 (six) hours as needed for moderate pain or severe pain. Patient not taking: Reported on 09/15/2015 05/08/14   Toy CookeyMegan Docherty, MD   BP 111/76 mmHg  Pulse 42  Temp(Src) 97.9 F (36.6 C) (Oral)  Resp 17  Ht 6' (1.829 m)  Wt 88.451 kg  BMI 26.44 kg/m2  SpO2 92% Physical Exam  Constitutional: He is oriented to person, place, and time. Vital signs are normal. He appears well-developed and well-nourished. He is uncooperative.  Non-toxic appearance. He does not have a sickly appearance. No distress.  HENT:  Head: Normocephalic and atraumatic.  Nose: Nose normal.  Mouth/Throat: Oropharynx is clear and moist. No oropharyngeal exudate.  Eyes: Conjunctivae and EOM are normal. Pupils are equal, round, and reactive to light. Right eye exhibits no discharge. Left eye exhibits no discharge. No scleral icterus.  Neck: Normal range of motion. No JVD present. No tracheal deviation present. No thyromegaly present.  Cardiovascular: Normal rate, regular rhythm, normal heart sounds, intact distal pulses and normal pulses.  Exam reveals no gallop and no friction rub.   No murmur heard.  Pulses:      Radial pulses are 2+ on the right side, and 2+ on the left side.       Dorsalis pedis pulses are 2+ on the right side, and 2+ on the left side.    Pulmonary/Chest: Effort normal and breath sounds normal. No accessory muscle usage. No tachypnea. No respiratory distress. He has no decreased breath sounds. He has no wheezes. He has no rhonchi. He has no rales. He exhibits tenderness.    Abdominal: Soft. Bowel sounds are normal. He exhibits no distension and no mass. There is no tenderness. There is no rebound and no guarding.  Musculoskeletal: Normal range of motion. He exhibits no edema or tenderness.  Lymphadenopathy:    He has no cervical adenopathy.  Neurological: He is alert and oriented to person, place, and time. He has normal strength and normal reflexes. No cranial nerve deficit or sensory deficit. He exhibits normal muscle tone. Coordination normal.  Skin: Skin is warm and dry. No rash noted. He is not diaphoretic. No erythema. No pallor.  Decreased skin turgor throughout, no pallor, no erythema  Psychiatric: He has a normal mood and affect. His behavior is normal. Judgment and thought content normal.  Nursing note and vitals reviewed.   ED Course  Procedures (including critical care time) Labs Review Labs Reviewed  BASIC METABOLIC PANEL - Abnormal; Notable for the following:    Glucose, Bld 109 (*)    All other components within normal limits  CBC - Abnormal; Notable for the following:    WBC 11.6 (*)    RBC 4.10 (*)    All other components within normal limits  I-STAT TROPOININ, ED    Imaging Review Dg Chest 2 View  04/01/2016  CLINICAL DATA:  Chest pain and sob x 4 days; smoker EXAM: CHEST - 2 VIEW COMPARISON:  09/15/2015 FINDINGS: Lungs are clear. Heart size and mediastinal contours are within normal limits. No effusion.  No pneumothorax. Visualized bones unremarkable. IMPRESSION: No acute cardiopulmonary disease. Electronically Signed   By: Corlis Leak  Hassell M.D.   On: 04/01/2016 22:38   I have personally reviewed and evaluated these images and lab results as part of my medical decision-making.   EKG Interpretation   Date/Time:  Wednesday April 01 2016 22:23:35 EDT Ventricular Rate:  59 PR Interval:  116 QRS Duration: 106 QT Interval:  426 QTC Calculation: 421 R Axis:   35 Text Interpretation:  Sinus bradycardia Cannot rule out Anterior infarct ,   age undetermined Abnormal ECG Confirmed by Lincoln Brighamees, Liz [phone] on 04/02/2016  4:27:08 PM      MDM   Patient is a [age] year old male, current smoker, presents with chest pain 4 days, the time of my evaluation, stating orders initiated in triage had resulted, I got the patient's history and reviewed results with him. He has concerns regarding vague family history of clotting disorders. He did not clinically appear suspicious for PE however I discussed how we could obtain a d-dimer to rule out blood clots, and if negative further workup would be appropriate outpatient. I spent roughly 5 minutes reviewing all of his lab results at the time of my initial evaluation, including EKG and cardiac enzymes.  He did provide conflicting history and became angry at times. He also appeared very offended when asked about smoking use, alcohol use or cocaine use, I explained to him that if the routine questions for anyone with chest pain or shortness of breath.  While exiting the exam room the patient  asked "how much longer as this can take, has been here all night," and I explained that it would take longer since he requested testing for blood clots.  The primary nurse stated the patient was angry and requested to leave, she did have him sign out AMA. I did not have it changed to speak with him. IV fluids breathing treatments, pain medication and d-dimer was ordered while is in the room with the patient however he received this because he left AMA.  My brief interaction with him, his chest pain was most suspicious for chest wall pain after lifting an object above his head at work, is reproducible on exam.  Work up and treatment was unable to be completed due to pt's abrupt departure.     Final diagnoses:  Chest wall pain      Danelle Berry, PA-C 04/03/16 1034  Alvira Monday, MD 04/06/16 [phone]

## 2016-05-07 ENCOUNTER — Emergency Department (HOSPITAL_COMMUNITY): Payer: Medicaid Other

## 2016-05-07 ENCOUNTER — Emergency Department (HOSPITAL_COMMUNITY)
Admission: EM | Admit: 2016-05-07 | Discharge: 2016-05-08 | Disposition: A | Discharge status: Home / Self Care | Payer: Medicaid Other | Attending: Emergency Medicine | Admitting: Emergency Medicine

## 2016-05-07 ENCOUNTER — Encounter (HOSPITAL_COMMUNITY): Payer: Self-pay

## 2016-05-07 DIAGNOSIS — Z7982 Long term (current) use of aspirin: Secondary | ICD-10-CM | POA: Diagnosis not present

## 2016-05-07 DIAGNOSIS — Z720 Tobacco use: Secondary | ICD-10-CM

## 2016-05-07 DIAGNOSIS — R0789 Other chest pain: Secondary | ICD-10-CM | POA: Diagnosis present

## 2016-05-07 DIAGNOSIS — F17210 Nicotine dependence, cigarettes, uncomplicated: Secondary | ICD-10-CM | POA: Insufficient documentation

## 2016-05-07 LAB — BASIC METABOLIC PANEL
Anion gap: 6 (ref 5–15)
BUN: 13 mg/dL (ref 6–20)
CHLORIDE: 107 mmol/L (ref 101–111)
CO2: 26 mmol/L (ref 22–32)
CREATININE: 0.97 mg/dL (ref 0.61–1.24)
Calcium: 9.2 mg/dL (ref 8.9–10.3)
GFR calc Af Amer: >60 mL/min (ref >60)
GFR calc non Af Amer: >60 mL/min (ref >60)
GLUCOSE: 90 mg/dL (ref 65–99)
Potassium: 3.6 mmol/L (ref 3.5–5.1)
SODIUM: 139 mmol/L (ref 135–145)

## 2016-05-07 LAB — CBC
HEMATOCRIT: 38.7 % — AB (ref 39.0–52.0)
Hemoglobin: 13.5 g/dL (ref 13.0–17.0)
MCH: 34 pg (ref 26.0–34.0)
MCHC: 34.9 g/dL (ref 30.0–36.0)
MCV: 97.5 fL (ref 78.0–100.0)
PLATELETS: 245 10*3/uL (ref 150–400)
RBC: 3.97 MIL/uL — ABNORMAL LOW (ref 4.22–5.81)
RDW: 12.7 % (ref 11.5–15.5)
WBC: 9.4 10*3/uL (ref 4.0–10.5)

## 2016-05-07 LAB — I-STAT TROPONIN, ED: Troponin i, poc: 0 ng/mL (ref 0.00–0.08)

## 2016-05-07 LAB — TROPONIN I: Troponin I: <0.03 ng/mL (ref <0.03)

## 2016-05-07 NOTE — ED Triage Notes (Signed)
Pt here for CP and shortness of breath X3 weeks, pt reports pain and SOB is intermittent. Resp e/u, no distress noted.

## 2016-05-07 NOTE — ED Provider Notes (Signed)
[facility]-EMERGENCY DEPT Provider Note   CSN: [id number] Arrival date & time: 05/07/16  1651  First Provider Contact:  None       History   Chief Complaint Chief Complaint  Patient presents with  . Chest Pain  . Shortness of Breath    HPI Devin Beasley is a 30 y.o. male who presents to the ED for evaluation of chest pain and shortness of breath. The patient reports that for the last 2 months he has been getting intermittent chest tightness and shortness of breath up to 2x/day. He also reports associated numbness and tingling of his hands, feet and mouth when symptoms occur. He reports his symptoms are brought on by physical activity, laying down to sleep and also during times of anxiety. The patient reports he is having some significant stressors in his life and his chest pain and shortness of breath is worsened when these stressors are particularly bad.  He denies any headache, diaphoresis, chest pressure, or any other symptoms.  He presented with his mother who reports his aunt had a MI at age 45 and is now on the list for a heart transplant.  HPI  History reviewed. No pertinent past medical history.  There are no active problems to display for this patient.   Past Surgical History:  Procedure Laterality Date  . FINGER SURGERY         Home Medications    Prior to Admission medications   Medication Sig Start Date End Date Taking? Authorizing Provider  Aspirin-Acetaminophen-Caffeine (EXCEDRIN EXTRA STRENGTH PO) Take 1-2 tablets by mouth daily as needed (for headaches).   Yes Historical Provider, MD  clindamycin (CLEOCIN) 150 MG capsule Take 3 capsules (450 mg total) by mouth 3 (three) times daily. For 10 days. Take all pills. Patient not taking: Reported on 09/15/2015 05/08/14   Toy Cookey, MD  ibuprofen (ADVIL,MOTRIN) 800 MG tablet Take 1 tablet (800 mg total) by mouth 3 (three) times daily. Patient not taking: Reported on 05/07/2016 09/15/15   Glynn Octave, MD    oxyCODONE-acetaminophen (PERCOCET/ROXICET) 5-325 MG per tablet Take 1 tablet by mouth every 6 (six) hours as needed for moderate pain or severe pain. Patient not taking: Reported on 09/15/2015 05/08/14   Toy Cookey, MD    Family History History reviewed. No pertinent family history.  Social History Social History  Substance Use Topics  . Smoking status: Current Every Day Smoker    Packs/day: 0.50    Types: Cigarettes  . Smokeless tobacco: Never Used  . Alcohol use No     Allergies   Review of patient's allergies indicates no known allergies.   Review of Systems Review of Systems  Constitutional: Negative for chills, diaphoresis, fatigue and fever.  Eyes: Negative for photophobia.  Respiratory: Positive for chest tightness. Negative for cough, shortness of breath and wheezing.   Cardiovascular: Positive for chest pain and palpitations. Negative for leg swelling.  Gastrointestinal: Negative for abdominal distention, abdominal pain, constipation, diarrhea, nausea and vomiting.  Genitourinary: Negative for difficulty urinating, dysuria, flank pain and frequency.  Musculoskeletal: Negative for arthralgias, back pain, joint swelling and myalgias.  Skin: Negative for rash.  Neurological: Positive for numbness. Negative for dizziness, seizures, light-headedness and headaches.     Physical Exam Updated Vital Signs BP 123/79   Pulse (!) 57   Temp 98.3 F (36.8 C) (Oral)   Resp 16   Ht 6' (1.829 m)   Wt 84.8 kg   SpO2 100%   BMI 25.36 kg/m  Physical Exam  Constitutional: He appears well-developed and well-nourished. No distress.  HENT:  Head: Normocephalic and atraumatic.  Eyes: Pupils are equal, round, and reactive to light.  Neck: No thyromegaly present.  Cardiovascular: Regular rhythm.  Bradycardia present.   Rate 56 during examination  Pulmonary/Chest: Effort normal and breath sounds normal.  Abdominal: Soft. Bowel sounds are normal.  Neurological: He is  alert. He has normal strength. He exhibits normal muscle tone.  Skin: Skin is warm and dry. He is not diaphoretic.  Psychiatric: He has a normal mood and affect. His behavior is normal.     ED Treatments / Results  Labs (all labs ordered are listed, but only abnormal results are displayed) Labs Reviewed  CBC - Abnormal; Notable for the following:       Result Value   RBC 3.97 (*)    HCT 38.7 (*)    All other components within normal limits  BASIC METABOLIC PANEL  TROPONIN I  I-STAT TROPOININ, ED    EKG  EKG Interpretation  Date/Time:  Thursday May 07 2016 16:58:27 EDT Ventricular Rate:  58 PR Interval:  124 QRS Duration: 96 QT Interval:  418 QTC Calculation: 410 R Axis:   53 Text Interpretation:  Sinus bradycardia Otherwise normal ECG Confirmed by MESNER MD, Barbara Cower [phone]) on 05/07/2016 11:01:48 PM       Radiology Dg Chest 2 View  Result Date: 05/07/2016 CLINICAL DATA:  Pt c/o left-sided and central chest pain and SOB x 1 month. No hx of heart or lung problems. Pt is a current smoker. EXAM: CHEST  2 VIEW COMPARISON:  04/01/2016 FINDINGS: The heart size and mediastinal contours are within normal limits. Both lungs are clear. No pleural effusion or pneumothorax. The visualized skeletal structures are unremarkable. IMPRESSION: Normal chest radiographs. Electronically Signed   By: Amie Portland M.D.   On: 05/07/2016 17:32   Procedures Procedures (including critical care time)  Medications Ordered in ED Medications - No data to display   Initial Impression / Assessment and Plan / ED Course  I have reviewed the triage vital signs and the nursing notes.  Pertinent labs & imaging results that were available during my care of the patient were reviewed by me and considered in my medical decision making (see chart for details).  Clinical Course   Patient complains of some numbness and tingling of his hands while in the ED.  Repeat EKG normal except HR in 40's. Upon  inspection of prior EKGs, this is baseline for the patient. Serial troponins negative.  Patient received 5-10 minutes of smoking cessation.  Results discussed with patient and agreed to discharge and establishing care with a PCP. Patient to return if symptoms worsen or change.    Final Clinical Impressions(s) / ED Diagnoses   Final diagnoses:  Other chest pain  Tobacco abuse    New Prescriptions New Prescriptions   No medications on file     Stormee Duda, DO 05/08/16 0028    Marily Memos, MD 05/08/16 [phone]

## 2016-05-07 NOTE — ED Notes (Signed)
Pt states he is walking his mother to the car and he will be right back

## 2016-05-08 NOTE — Discharge Instructions (Signed)
Please establish and then follow up with a primary care doctor (PCP). Please return to ER if your symptoms worsen. Please try to quit smoking as well.

## 2017-02-23 ENCOUNTER — Encounter (HOSPITAL_COMMUNITY): Payer: Self-pay | Admitting: Emergency Medicine

## 2017-02-23 DIAGNOSIS — Z7982 Long term (current) use of aspirin: Secondary | ICD-10-CM | POA: Diagnosis not present

## 2017-02-23 DIAGNOSIS — F17210 Nicotine dependence, cigarettes, uncomplicated: Secondary | ICD-10-CM | POA: Insufficient documentation

## 2017-02-23 DIAGNOSIS — R1030 Lower abdominal pain, unspecified: Secondary | ICD-10-CM | POA: Diagnosis not present

## 2017-02-23 DIAGNOSIS — N50811 Right testicular pain: Secondary | ICD-10-CM | POA: Diagnosis present

## 2017-02-23 DIAGNOSIS — N433 Hydrocele, unspecified: Secondary | ICD-10-CM | POA: Diagnosis not present

## 2017-02-23 NOTE — ED Triage Notes (Signed)
Pt presents with R sided groin pain; pt denies any strenuous activity; pt states the pain starts below the stomach and runs down through the right side of the scrotum; pt denies urinary changes, denies penile discharge or lesions

## 2017-02-24 ENCOUNTER — Emergency Department (HOSPITAL_COMMUNITY)
Admission: EM | Admit: 2017-02-24 | Discharge: 2017-02-24 | Disposition: A | Discharge status: Home / Self Care | Payer: Medicaid Other | Attending: Emergency Medicine | Admitting: Emergency Medicine

## 2017-02-24 ENCOUNTER — Emergency Department (HOSPITAL_COMMUNITY): Payer: Medicaid Other

## 2017-02-24 DIAGNOSIS — N433 Hydrocele, unspecified: Secondary | ICD-10-CM

## 2017-02-24 DIAGNOSIS — R1031 Right lower quadrant pain: Secondary | ICD-10-CM

## 2017-02-24 LAB — I-STAT CHEM 8, ED
BUN: 21 mg/dL — AB (ref 6–20)
Calcium, Ion: 1.17 mmol/L (ref 1.15–1.40)
Chloride: 105 mmol/L (ref 101–111)
Creatinine, Ser: 1.1 mg/dL (ref 0.61–1.24)
Glucose, Bld: 100 mg/dL — ABNORMAL HIGH (ref 65–99)
HEMATOCRIT: 39 % (ref 39.0–52.0)
HEMOGLOBIN: 13.3 g/dL (ref 13.0–17.0)
Potassium: 3.8 mmol/L (ref 3.5–5.1)
SODIUM: 141 mmol/L (ref 135–145)
TCO2: 26 mmol/L (ref 0–100)

## 2017-02-24 LAB — URINALYSIS, ROUTINE W REFLEX MICROSCOPIC
Bilirubin Urine: NEGATIVE
Glucose, UA: NEGATIVE mg/dL
Hgb urine dipstick: NEGATIVE
KETONES UR: NEGATIVE mg/dL
LEUKOCYTES UA: NEGATIVE
NITRITE: NEGATIVE
PH: 5 (ref 5.0–8.0)
PROTEIN: NEGATIVE mg/dL
Specific Gravity, Urine: 1.03 (ref 1.005–1.030)

## 2017-02-24 MED ORDER — ONDANSETRON 4 MG PO TBDP
8.0000 mg | ORAL_TABLET | Freq: Once | ORAL | Status: AC
  Administered 2017-02-24: 8 mg via ORAL
  Filled 2017-02-24: qty 2

## 2017-02-24 MED ORDER — IBUPROFEN 800 MG PO TABS: 800.0000 mg | ORAL_TABLET | Freq: Three times a day (TID) | ORAL | 0 refills | Status: AC

## 2017-02-24 MED ORDER — HYDROCODONE-ACETAMINOPHEN 5-325 MG PO TABS
2.0000 | ORAL_TABLET | Freq: Once | ORAL | Status: AC
  Administered 2017-02-24: 2 via ORAL
  Filled 2017-02-24: qty 2

## 2017-02-24 NOTE — ED Provider Notes (Signed)
[facility]-EMERGENCY DEPT Provider Note   CSN: [id number] Arrival date & time: 02/23/17  2320     History   Chief Complaint Chief Complaint  Patient presents with  . Groin Pain    HPI Devin Beasley is a 31 y.o. male.  Patient presents to the emergency department with chief complaint of right groin and right testicle pain. He states the symptoms started suddenly last night and have gradually worsened. He states that he has not had any dysuria, hematuria, or penile discharge. He reports increased pain when his testicles are "hanging free." He reports improvement of pain with scrotal support. He denies any fevers, chills, nausea, or vomiting. He denies any new sexual contacts. He states that he does have some right-sided flank pain as well. He denies any new masses or lesions. He denies any other medical problems.   The history is provided by the patient. No language interpreter was used.    History reviewed. No pertinent past medical history.  There are no active problems to display for this patient.   Past Surgical History:  Procedure Laterality Date  . FINGER SURGERY         Home Medications    Prior to Admission medications   Medication Sig Start Date End Date Taking? Authorizing Provider  Aspirin-Acetaminophen-Caffeine (EXCEDRIN EXTRA STRENGTH PO) Take 1-2 tablets by mouth daily as needed (for headaches).   Yes [provider]    Family History History reviewed. No pertinent family history.  Social History Social History  Substance Use Topics  . Smoking status: Current Every Day Smoker    Packs/day: 0.50    Types: Cigarettes  . Smokeless tobacco: Never Used  . Alcohol use No     Allergies   Patient has no known allergies.   Review of Systems Review of Systems  All other systems reviewed and are negative.    Physical Exam Updated Vital Signs BP 110/79 (BP Location: Left Arm)   Pulse 68   Temp 98.1 F (36.7 C) (Oral)   Resp 18   Ht 6'  (1.829 m)   Wt 91.6 kg   SpO2 100%   BMI 27.40 kg/m   Physical Exam  Constitutional: He is oriented to person, place, and time. He appears well-developed and well-nourished.  HENT:  Head: Normocephalic and atraumatic.  Eyes: Conjunctivae and EOM are normal.  Neck: Normal range of motion.  Cardiovascular: Normal rate.   Pulmonary/Chest: Effort normal.  Abdominal: He exhibits no distension.  Genitourinary:  Genitourinary Comments: No focal tenderness on exam, no masses or lesions, no inguinal hernia or palpable lymph nodes, no abscess or cellulitis  Musculoskeletal: Normal range of motion.  Neurological: He is alert and oriented to person, place, and time.  Skin: Skin is dry.  Psychiatric: He has a normal mood and affect. His behavior is normal. Judgment and thought content normal.  Nursing note and vitals reviewed.    ED Treatments / Results  Labs (all labs ordered are listed, but only abnormal results are displayed) Labs Reviewed  URINALYSIS, ROUTINE W REFLEX MICROSCOPIC  I-STAT CHEM 8, ED    EKG  EKG Interpretation None       Radiology No results found.  Procedures Procedures (including critical care time)  Medications Ordered in ED Medications  ondansetron (ZOFRAN-ODT) disintegrating tablet 8 mg (8 mg Oral Given 02/24/17 0231)  HYDROcodone-acetaminophen (NORCO/VICODIN) 5-325 MG per tablet 2 tablet (2 tablets Oral Given 02/24/17 0231)     Initial Impression / Assessment and Plan /  ED Course  I have reviewed the triage vital signs and the nursing notes.  Pertinent labs & imaging results that were available during my care of the patient were reviewed by me and considered in my medical decision making (see chart for details).     Patient with right flank and groin pain. Will check urinalysis and chem 8. Denies any dysuria, hematuria, or discharge. Check ultrasound to rule out torsion. Will reassess.  Ultrasound shows findings consistent with hydrocele, but no  other acute process. No evidence of torsion. Urinalysis shows no hemoglobin or evidence of infection. Normal kidney function. Will recommend scrotal support and follow-up with urology and anti-inflammatories as needed.   Final Clinical Impressions(s) / ED Diagnoses   Final diagnoses:  Right inguinal pain  Hydrocele in adult    New Prescriptions New Prescriptions   IBUPROFEN (ADVIL,MOTRIN) 800 MG TABLET    Take 1 tablet (800 mg total) by mouth 3 (three) times daily.     Roxy Horseman, PA-C 02/24/17 1610    Dione Booze, MD 02/24/17 [phone]

## 2017-02-24 NOTE — ED Notes (Signed)
Phlebotomy, Kayla asked to draw blood work. ED Tech, Benita asked to inquire with patient about urine sample.

## 2017-12-30 ENCOUNTER — Encounter (HOSPITAL_COMMUNITY): Payer: Self-pay | Admitting: *Deleted

## 2017-12-30 ENCOUNTER — Other Ambulatory Visit: Payer: Self-pay

## 2017-12-30 ENCOUNTER — Emergency Department (HOSPITAL_COMMUNITY)
Admission: EM | Admit: 2017-12-30 | Discharge: 2017-12-30 | Disposition: A | Discharge status: Home / Self Care | Payer: Medicaid Other | Attending: Emergency Medicine | Admitting: Emergency Medicine

## 2017-12-30 DIAGNOSIS — K0889 Other specified disorders of teeth and supporting structures: Secondary | ICD-10-CM | POA: Insufficient documentation

## 2017-12-30 DIAGNOSIS — Z79899 Other long term (current) drug therapy: Secondary | ICD-10-CM | POA: Insufficient documentation

## 2017-12-30 DIAGNOSIS — F17210 Nicotine dependence, cigarettes, uncomplicated: Secondary | ICD-10-CM | POA: Diagnosis not present

## 2017-12-30 MED ORDER — NAPROXEN 500 MG PO TABS: 500.0000 mg | ORAL_TABLET | Freq: Two times a day (BID) | ORAL | 0 refills | Status: DC

## 2017-12-30 MED ORDER — PENICILLIN V POTASSIUM 250 MG PO TABS
500.0000 mg | ORAL_TABLET | Freq: Once | ORAL | Status: AC
  Administered 2017-12-30: 500 mg via ORAL
  Filled 2017-12-30: qty 2

## 2017-12-30 MED ORDER — OXYCODONE-ACETAMINOPHEN 5-325 MG PO TABS: 1.0000 | ORAL_TABLET | Freq: Three times a day (TID) | ORAL | 0 refills | Status: AC | PRN

## 2017-12-30 MED ORDER — OXYCODONE-ACETAMINOPHEN 5-325 MG PO TABS
1.0000 | ORAL_TABLET | Freq: Once | ORAL | Status: AC
  Administered 2017-12-30: 1 via ORAL
  Filled 2017-12-30: qty 1

## 2017-12-30 MED ORDER — PENICILLIN V POTASSIUM 500 MG PO TABS: 500.0000 mg | ORAL_TABLET | Freq: Four times a day (QID) | ORAL | 0 refills | Status: AC

## 2017-12-30 NOTE — ED Provider Notes (Signed)
[facility] EMERGENCY DEPARTMENT Provider Note   CSN: [id number] Arrival date & time: 12/30/17  1446     History   Chief Complaint Chief Complaint  Patient presents with  . Dental Pain    HPI Devin Beasley is a 32 y.o. male with no significant past medical history, who presents to ED for evaluation of left lateral incisor pain for the past 3 days.  States that symptoms began after eating.  He has had a history of similar symptoms in the past with this tooth and several of his teeth.  He has not seen a dentist in over a year.  He has tried some leftover amoxicillin and ibuprofen yesterday with no improvement in his symptoms.  Reports the pain is sharp and worse with movement of the mouth.  Denies any trouble breathing or trouble swallowing, trismus, fever, drainage or bleeding from site, neck pain, changes in voice, rashes.  HPI  History reviewed. No pertinent past medical history.  There are no active problems to display for this patient.   Past Surgical History:  Procedure Laterality Date  . FINGER SURGERY         Home Medications    Prior to Admission medications   Medication Sig Start Date End Date Taking? Authorizing Provider  Aspirin-Acetaminophen-Caffeine (EXCEDRIN EXTRA STRENGTH PO) Take 1-2 tablets by mouth daily as needed (for headaches).    [provider]  ibuprofen (ADVIL,MOTRIN) 800 MG tablet Take 1 tablet (800 mg total) by mouth 3 (three) times daily. 02/24/17   Roxy Horseman, PA-C  naproxen (NAPROSYN) 500 MG tablet Take 1 tablet (500 mg total) by mouth 2 (two) times daily. 12/30/17   Meri Pelot, PA-C  oxyCODONE-acetaminophen (PERCOCET/ROXICET) 5-325 MG tablet Take 1 tablet by mouth every 8 (eight) hours as needed for severe pain. 12/30/17   Mckell Riecke, PA-C  penicillin v potassium (VEETID) 500 MG tablet Take 1 tablet (500 mg total) by mouth 4 (four) times daily for 7 days. 12/30/17 01/06/18  Dietrich Pates, PA-C    Family  History No family history on file.  Social History Social History   Tobacco Use  . Smoking status: Current Every Day Smoker    Packs/day: 0.50    Types: Cigarettes  . Smokeless tobacco: Never Used  Substance Use Topics  . Alcohol use: No  . Drug use: Yes    Comment: Opiates--roxicodone      Allergies   Patient has no known allergies.   Review of Systems Review of Systems  Constitutional: Negative for chills and fever.  HENT: Positive for dental problem and facial swelling. Negative for congestion, drooling, nosebleeds, rhinorrhea, sinus pressure, sore throat and trouble swallowing.   Respiratory: Negative for cough and shortness of breath.   Gastrointestinal: Negative for nausea and vomiting.  Musculoskeletal: Negative for neck pain and neck stiffness.  Skin: Negative for rash.  Neurological: Negative for headaches.     Physical Exam Updated Vital Signs BP 128/85 (BP Location: Right Arm)   Pulse 66   Temp 98.3 F (36.8 C) (Oral)   Resp 16   SpO2 100%   Physical Exam  Constitutional: He appears well-developed and well-nourished. No distress.  HENT:  Head: Normocephalic and atraumatic.  Nose: Nose normal.  Mouth/Throat: Uvula is midline. He does not have dentures. No trismus in the jaw. Abnormal dentition. Dental caries present. No dental abscesses or uvula swelling.    Mild L upper cheek swelling noted.  Tenderness to palpation at the indicated tooth which  is chipped and became.  No gross abscess or site of drainage at this time. No facial, neck or cheek swelling noted. No pooling of secretions or trismus.  Normal voice noted with no difficulty swallowing or breathing. No submandibular erythema, edema or crepitus noted.  Eyes: Conjunctivae and EOM are normal. No scleral icterus.  Neck: Normal range of motion.  Pulmonary/Chest: Effort normal. No respiratory distress.  Neurological: He is alert.  Skin: No rash noted. He is not diaphoretic.  Psychiatric: He has a  normal mood and affect.  Nursing note and vitals reviewed.    ED Treatments / Results  Labs (all labs ordered are listed, but only abnormal results are displayed) Labs Reviewed - No data to display  EKG  EKG Interpretation None       Radiology No results found.   EMERGENCY DEPARTMENT US SOFT TISSUE INTERPRETATION "Study: Limited Soft Tissue Ultrasound"  INDICATIONS: Pain Multiple views of the body part were obtained in real-time with a multi-frequency linear probe  PERFORMED BY: Myself IMAGES ARCHIVED?: No SIDE:Left BODY PART:dental INTERPRETATION:  No abcess noted and Cellulitis present    Procedures Procedures (including critical care time)  Medications Ordered in ED Medications  oxyCODONE-acetaminophen (PERCOCET/ROXICET) 5-325 MG per tablet 1 tablet (1 tablet Oral Given 12/30/17 1621)  penicillin v potassium (VEETID) tablet 500 mg (500 mg Oral Given 12/30/17 1621)     Initial Impression / Assessment and Plan / ED Course  I have reviewed the triage vital signs and the nursing notes.  Pertinent labs & imaging results that were available during my care of the patient were reviewed by me and considered in my medical decision making (see chart for details).     Patient presents to ED for evaluation of left lateral incisor pain that is worse with movement.  He had a history of similar symptoms in the past and this tooth and other teeth and is not significantly for over a year.  There is mild upper left cheek swelling noted but no visible abscess or site of drainage at this time.  He is afebrile.  He has no submandibular erythema, edema or crepitus noted.  Ultrasound revealed no abscess but did show induration in the area.  Patient with dentalgia. On exam, there is no evidence of a drainable abscess. No trismus, glossal elevation, unilateral tonsillar swelling. No evidence of retropharyngeal or peritonsillar abscess or Ludwig angina. Will treat with  short course of pain  medication, antibiotics. Pt instructed to follow-up with dentist as soon as possible. Resource guide provided with AVS. [place] narcotic database reviewed with no discrepancies. Patient appears stable for discharge at this time.  Strict return precautions given.  Portions of this note were generated with Scientist, clinical (histocompatibility and immunogenetics)Dragon dictation software. Dictation errors may occur despite best attempts at proofreading.   Final Clinical Impressions(s) / ED Diagnoses   Final diagnoses:  Pain, dental    ED Discharge Orders        Ordered    penicillin v potassium (VEETID) 500 MG tablet  4 times daily     12/30/17 1618    oxyCODONE-acetaminophen (PERCOCET/ROXICET) 5-325 MG tablet  Every 8 hours PRN     12/30/17 1618    naproxen (NAPROSYN) 500 MG tablet  2 times daily     12/30/17 1618       Dietrich PatesKhatri, Markeis Allman, PA-C 12/30/17 1630    Charlynne PanderYao, David Hsienta, MD 12/30/17 [phone]

## 2017-12-30 NOTE — ED Triage Notes (Signed)
To ED for eval of left side facial swelling and abscessed tooth. Pt tearful. States he found some abx and took some last pm- unknown what kind they are.

## 2017-12-31 ENCOUNTER — Encounter (HOSPITAL_COMMUNITY): Payer: Self-pay | Admitting: Emergency Medicine

## 2017-12-31 ENCOUNTER — Emergency Department (HOSPITAL_COMMUNITY)
Admission: EM | Admit: 2017-12-31 | Discharge: 2017-12-31 | Disposition: A | Discharge status: Home / Self Care | Payer: Medicaid Other | Attending: Emergency Medicine | Admitting: Emergency Medicine

## 2017-12-31 DIAGNOSIS — R220 Localized swelling, mass and lump, head: Secondary | ICD-10-CM | POA: Diagnosis present

## 2017-12-31 DIAGNOSIS — Z79899 Other long term (current) drug therapy: Secondary | ICD-10-CM | POA: Insufficient documentation

## 2017-12-31 DIAGNOSIS — K047 Periapical abscess without sinus: Secondary | ICD-10-CM | POA: Diagnosis not present

## 2017-12-31 DIAGNOSIS — F17210 Nicotine dependence, cigarettes, uncomplicated: Secondary | ICD-10-CM | POA: Insufficient documentation

## 2017-12-31 DIAGNOSIS — Z7982 Long term (current) use of aspirin: Secondary | ICD-10-CM | POA: Diagnosis not present

## 2017-12-31 MED ORDER — LIDOCAINE HCL 2 % IJ SOLN
5.0000 mL | Freq: Once | INTRAMUSCULAR | Status: AC
  Administered 2017-12-31: 100 mg via INTRADERMAL
  Filled 2017-12-31: qty 20

## 2017-12-31 NOTE — ED Provider Notes (Signed)
[facility] Provider [facility]   CSN: [id number] Arrival date & time: 12/31/17  1700     History   Chief Complaint Chief Complaint  Patient presents with  . Dental Pain    HPI Devin Beasley is a 32 y.o. male.  HPI   32 year old male presenting complaining of dental pain.  For the past 4 days he has been having increasing worsening pain involving his left upper front tooth.  Described pain as a sharp throbbing sensation worse with chewing or with palpation.  He also noticed increasing swelling to the affected area.  He denies fever, difficulty swallowing, neck pain, change in his voice or any recent injury.  He was seen in the ED for this complaint yesterday.  The provider did do a bedside soft tissue ultrasound but did not see any evidence of an abscess.  Patient is ultimately discharged home with penicillin and pain medication.  Patient returns today due to noticing increasing swelling in his gums at the affected site.  His pain is still moderate in severity and described as a throbbing sensation, rating up towards his face.  No pain with eye movement no change in vision.  No fever nausea vomiting or diarrhea.  He is up-to-date with tetanus.  History reviewed. No pertinent past medical history.  There are no active problems to display for this patient.   Past Surgical History:  Procedure Laterality Date  . FINGER SURGERY          Home Medications    Prior to Admission medications   Medication Sig Start Date End Date Taking? Authorizing Provider  Aspirin-Acetaminophen-Caffeine (EXCEDRIN EXTRA STRENGTH PO) Take 1-2 tablets by mouth daily as needed (for headaches).    [provider]  ibuprofen (ADVIL,MOTRIN) 800 MG tablet Take 1 tablet (800 mg total) by mouth 3 (three) times daily. 02/24/17   Roxy HorsemanBrowning, Robert, PA-C  naproxen (NAPROSYN) 500 MG tablet Take 1 tablet (500 mg total) by mouth 2 (two) times daily. 12/30/17   Khatri, Hina,  PA-C  oxyCODONE-acetaminophen (PERCOCET/ROXICET) 5-325 MG tablet Take 1 tablet by mouth every 8 (eight) hours as needed for severe pain. 12/30/17   Khatri, Hina, PA-C  penicillin v potassium (VEETID) 500 MG tablet Take 1 tablet (500 mg total) by mouth 4 (four) times daily for 7 days. 12/30/17 01/06/18  Dietrich PatesKhatri, Hina, PA-C    Family History No family history on file.  Social History Social History   Tobacco Use  . Smoking status: Current Every Day Smoker    Packs/day: 0.50    Types: Cigarettes  . Smokeless tobacco: Never Used  Substance Use Topics  . Alcohol use: No  . Drug use: Yes    Comment: Opiates--roxicodone      Allergies   Patient has no known allergies.   Review of Systems Review of Systems  Constitutional: Negative for fever.  HENT: Positive for dental problem and facial swelling. Negative for trouble swallowing.   Neurological: Negative for headaches.     Physical Exam Updated Vital Signs BP 125/74 (BP Location: Right Arm)   Pulse 68   Temp 98.6 F (37 C) (Oral)   Resp 16   SpO2 98%   Physical Exam  Constitutional: He appears well-developed and well-nourished. No distress.  HENT:  Head: Atraumatic.  Mild swelling noted to the left preseptal region.  No pain with eye movement.  In patient's mouth, an area of induration and fluctuance measuring approximately 1 cm were noted to the anterior  maxillary gumline tender to palpation.  Widespread dental decay noted.  Eyes: Conjunctivae are normal.  Neck: Neck supple.  Cardiovascular: Normal rate and regular rhythm.  Pulmonary/Chest: Effort normal and breath sounds normal.  Lymphadenopathy:    He has cervical adenopathy.  Neurological: He is alert.  Skin: No rash noted.  Psychiatric: He has a normal mood and affect.  Nursing note and vitals reviewed.    ED Treatments / Results  Labs (all labs ordered are listed, but only abnormal results are displayed) Labs Reviewed - No data to  display  EKG None  Radiology No results found.  Procedures .Marland KitchenIncision and Drainage Date/Time: 12/31/2017 7:09 PM Performed by: Fayrene Helper, PA-C Authorized by: Fayrene Helper, PA-C   Consent:    Consent obtained:  Verbal   Consent given by:  Patient   Risks discussed:  Incomplete drainage   Alternatives discussed:  No treatment Location:    Type:  Abscess   Size:  1cm   Location:  Mouth   Mouth location: upper anterior gum. Anesthesia (see MAR for exact dosages):    Anesthesia method:  Local infiltration   Local anesthetic:  Lidocaine 2% w/o epi Procedure type:    Complexity:  Simple Procedure details:    Incision types:  Stab incision   Incision depth:  Submucosal   Scalpel blade:  11   Wound management:  Probed and deloculated   Drainage:  Purulent   Drainage amount:  Scant   Wound treatment:  Wound left open   Packing materials:  None Post-procedure details:    Patient tolerance of procedure:  Tolerated well, no immediate complications   (including critical care time)  Medications Ordered in ED Medications - No data to display   Initial Impression / Assessment and Plan / ED Course  I have reviewed the triage vital signs and the nursing notes.  Pertinent labs & imaging results that were available during my care of the patient were reviewed by me and considered in my medical decision making (see chart for details).     BP 125/74 (BP Location: Right Arm)   Pulse 68   Temp 98.6 F (37 C) (Oral)   Resp 16   SpO2 98%    Final Clinical Impressions(s) / ED Diagnoses   Final diagnoses:  Dental abscess    ED Discharge Orders    None     6:45 PM Patient with very poor dentition here with swelling to his gum consistent with a periapical abscess.  There is some preseptal skin changes involving the left side of his face.  No evidence to suggest orbital cellulitis.  He is up-to-date with tetanus.  Will perform incision and drainage for symptomatic relief of  pressure.  7:11 PM Successful I&D, pt felt better.  Recommend continue with current treatment and f/u with dentist for further care. Return precaution given.    Fayrene Helper, PA-C 12/31/17 1913    Charlynne Pander, MD 12/31/17 [phone]

## 2017-12-31 NOTE — ED Triage Notes (Signed)
Pt here for dental abscess, seen here yesterday and had an ultrasound done, was told he had nothing that needed to be drained. Taking and antibiotic but he is not sure which one. Pt states I have a swollen lump on left gums, noted. States the would not cut it open and he needs it cut open, swelling noted under left eye.

## 2017-12-31 NOTE — Discharge Instructions (Addendum)
Follow up with your dentist for further management.  Continue taking antibiotic and pain medications previously prescribed.

## 2018-07-20 ENCOUNTER — Emergency Department (HOSPITAL_COMMUNITY): Payer: Medicaid Other

## 2018-07-20 ENCOUNTER — Emergency Department (HOSPITAL_COMMUNITY)
Admission: EM | Admit: 2018-07-20 | Discharge: 2018-07-20 | Disposition: A | Discharge status: Home / Self Care | Payer: Medicaid Other | Attending: Emergency Medicine | Admitting: Emergency Medicine

## 2018-07-20 ENCOUNTER — Other Ambulatory Visit: Payer: Self-pay

## 2018-07-20 ENCOUNTER — Encounter (HOSPITAL_COMMUNITY): Payer: Self-pay | Admitting: Emergency Medicine

## 2018-07-20 DIAGNOSIS — Z7982 Long term (current) use of aspirin: Secondary | ICD-10-CM | POA: Diagnosis not present

## 2018-07-20 DIAGNOSIS — M79642 Pain in left hand: Secondary | ICD-10-CM

## 2018-07-20 DIAGNOSIS — F17210 Nicotine dependence, cigarettes, uncomplicated: Secondary | ICD-10-CM | POA: Diagnosis not present

## 2018-07-20 DIAGNOSIS — M546 Pain in thoracic spine: Secondary | ICD-10-CM | POA: Insufficient documentation

## 2018-07-20 DIAGNOSIS — Z79899 Other long term (current) drug therapy: Secondary | ICD-10-CM | POA: Diagnosis not present

## 2018-07-20 DIAGNOSIS — M542 Cervicalgia: Secondary | ICD-10-CM | POA: Insufficient documentation

## 2018-07-20 DIAGNOSIS — R0781 Pleurodynia: Secondary | ICD-10-CM

## 2018-07-20 MED ORDER — NAPROXEN 250 MG PO TABS
500.0000 mg | ORAL_TABLET | Freq: Once | ORAL | Status: AC
  Administered 2018-07-20: 500 mg via ORAL
  Filled 2018-07-20: qty 2

## 2018-07-20 MED ORDER — METHOCARBAMOL 500 MG PO TABS: 500.0000 mg | ORAL_TABLET | Freq: Two times a day (BID) | ORAL | 0 refills | Status: AC

## 2018-07-20 MED ORDER — NAPROXEN 500 MG PO TABS: 500.0000 mg | ORAL_TABLET | Freq: Two times a day (BID) | ORAL | 0 refills | Status: AC

## 2018-07-20 MED ORDER — METHOCARBAMOL 500 MG PO TABS
500.0000 mg | ORAL_TABLET | Freq: Once | ORAL | Status: AC
  Administered 2018-07-20: 500 mg via ORAL
  Filled 2018-07-20: qty 1

## 2018-07-20 NOTE — ED Notes (Signed)
Pt called for room, no answer.

## 2018-07-20 NOTE — ED Notes (Signed)
Patient transported to X-ray 
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
## 2018-07-20 NOTE — Discharge Instructions (Addendum)
The pain your experiencing is likely due to muscle strain, you may take Ibuprofen and Robaxin as needed for pain management. Do not combine with any pain reliever other than tylenol. The muscle soreness should improve over the next week. Follow up with your family doctor in the next week for a recheck if you are still having symptoms. Return to ED if pain is worsening, you develop weakness or numbness of extremities, or new or concerning symptoms develop. °
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
## 2019-08-10 ENCOUNTER — Other Ambulatory Visit: Payer: Self-pay

## 2019-08-10 ENCOUNTER — Encounter (HOSPITAL_COMMUNITY): Payer: Self-pay | Admitting: Emergency Medicine

## 2019-08-10 ENCOUNTER — Emergency Department (HOSPITAL_COMMUNITY): Payer: Medicaid Other

## 2019-08-10 ENCOUNTER — Emergency Department (HOSPITAL_COMMUNITY)
Admission: EM | Admit: 2019-08-10 | Discharge: 2019-08-10 | Disposition: A | Discharge status: Home / Self Care | Payer: Medicaid Other | Attending: Emergency Medicine | Admitting: Emergency Medicine

## 2019-08-10 DIAGNOSIS — F17210 Nicotine dependence, cigarettes, uncomplicated: Secondary | ICD-10-CM | POA: Insufficient documentation

## 2019-08-10 DIAGNOSIS — W501XXA Accidental kick by another person, initial encounter: Secondary | ICD-10-CM | POA: Insufficient documentation

## 2019-08-10 DIAGNOSIS — S2242XA Multiple fractures of ribs, left side, initial encounter for closed fracture: Secondary | ICD-10-CM | POA: Diagnosis not present

## 2019-08-10 DIAGNOSIS — Y999 Unspecified external cause status: Secondary | ICD-10-CM | POA: Insufficient documentation

## 2019-08-10 DIAGNOSIS — S299XXA Unspecified injury of thorax, initial encounter: Secondary | ICD-10-CM | POA: Diagnosis present

## 2019-08-10 DIAGNOSIS — Y929 Unspecified place or not applicable: Secondary | ICD-10-CM | POA: Insufficient documentation

## 2019-08-10 DIAGNOSIS — Y939 Activity, unspecified: Secondary | ICD-10-CM | POA: Diagnosis not present

## 2019-08-10 LAB — CBC
HCT: 40.9 % (ref 39.0–52.0)
Hemoglobin: 13.8 g/dL (ref 13.0–17.0)
MCH: 34.6 pg — ABNORMAL HIGH (ref 26.0–34.0)
MCHC: 33.7 g/dL (ref 30.0–36.0)
MCV: 102.5 fL — ABNORMAL HIGH (ref 80.0–100.0)
Platelets: 264 10*3/uL (ref 150–400)
RBC: 3.99 MIL/uL — ABNORMAL LOW (ref 4.22–5.81)
RDW: 12.7 % (ref 11.5–15.5)
WBC: 17.4 10*3/uL — ABNORMAL HIGH (ref 4.0–10.5)
nRBC: 0 % (ref 0.0–0.2)

## 2019-08-10 LAB — BASIC METABOLIC PANEL
Anion gap: 10 (ref 5–15)
BUN: 17 mg/dL (ref 6–20)
CO2: 27 mmol/L (ref 22–32)
Calcium: 9.4 mg/dL (ref 8.9–10.3)
Chloride: 104 mmol/L (ref 98–111)
Creatinine, Ser: 1.17 mg/dL (ref 0.61–1.24)
GFR calc Af Amer: >60 mL/min (ref >60)
GFR calc non Af Amer: >60 mL/min (ref >60)
Glucose, Bld: 111 mg/dL — ABNORMAL HIGH (ref 70–99)
Potassium: 4 mmol/L (ref 3.5–5.1)
Sodium: 141 mmol/L (ref 135–145)

## 2019-08-10 LAB — TROPONIN I (HIGH SENSITIVITY)
Troponin I (High Sensitivity): 4 ng/L (ref <18)
Troponin I (High Sensitivity): 4 ng/L (ref <18)

## 2019-08-10 MED ORDER — SODIUM CHLORIDE 0.9% FLUSH: 3.0000 mL | Freq: Once | INTRAVENOUS | Status: DC

## 2019-08-10 MED ORDER — IBUPROFEN 400 MG PO TABS: 400.0000 mg | ORAL_TABLET | Freq: Once | ORAL | Status: DC | PRN

## 2019-08-10 NOTE — ED Notes (Signed)
No answer in waiting room 
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
## 2019-08-10 NOTE — ED Notes (Signed)
Pt's mom Michai Dieppa [phone]

## 2019-08-10 NOTE — ED Notes (Signed)
No answer in waiting room per Tonia [place], [place]

## 2019-08-10 NOTE — Discharge Instructions (Addendum)
For additional relief of from your rib pain, and to facilitate healing, please obtain topical medication patches including the ingredients lidocaine and methyl salicylate.  Jenel Lucks is one manufacturer of this product.  Please continue your efforts to stop smoking, drink alcohol only in moderation, and schedule a follow-up visit with your physician.  Return here for concerning changes in your condition.

## 2019-08-10 NOTE — ED Notes (Signed)
Please let pt know to call sister [place] if needs a ride home, and his shoes are on the back of her truck.   Sisters # [phone]

## 2019-08-10 NOTE — ED Notes (Signed)
Patient verbalizes understanding of discharge instructions. Opportunity for questioning and answers were provided. Armband removed by staff, pt discharged from ED.  

## 2019-08-10 NOTE — ED Notes (Signed)
Pt reports generalized chest pain with dizziness.
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
